# Patient Record
Sex: Male | Born: 1970 | Race: Black or African American | Hispanic: No | Marital: Single | Smoking: Never smoker
Health system: Southern US, Community
[De-identification: ages and names within clinical notes are randomized; demographics above are authoritative.]

## PROBLEM LIST (undated history)

## (undated) DIAGNOSIS — M722 Plantar fascial fibromatosis: Secondary | ICD-10-CM

## (undated) HISTORY — PX: APPENDECTOMY: SHX54

---

## 2015-01-28 ENCOUNTER — Other Ambulatory Visit: Payer: Self-pay | Admitting: Occupational Medicine

## 2015-01-28 ENCOUNTER — Ambulatory Visit: Payer: Self-pay

## 2015-01-28 DIAGNOSIS — M25572 Pain in left ankle and joints of left foot: Secondary | ICD-10-CM

## 2015-06-13 ENCOUNTER — Encounter (HOSPITAL_COMMUNITY): Payer: Self-pay | Admitting: Emergency Medicine

## 2015-06-13 ENCOUNTER — Emergency Department (INDEPENDENT_AMBULATORY_CARE_PROVIDER_SITE_OTHER)
Admission: EM | Admit: 2015-06-13 | Discharge: 2015-06-13 | Disposition: A | Discharge status: Home / Self Care | Payer: 59 | Source: Home / Self Care | Attending: Family Medicine | Admitting: Family Medicine

## 2015-06-13 DIAGNOSIS — K529 Noninfective gastroenteritis and colitis, unspecified: Secondary | ICD-10-CM | POA: Diagnosis not present

## 2015-06-13 MED ORDER — ONDANSETRON HCL 4 MG PO TABS: 4.0000 mg | ORAL_TABLET | Freq: Four times a day (QID) | ORAL | Status: DC

## 2015-06-13 NOTE — Discharge Instructions (Signed)

## 2015-06-13 NOTE — ED Provider Notes (Signed)
CSN: [id number]     Arrival date & time 06/13/15  1802 History   First MD Initiated Contact with Patient 06/13/15 1958     No chief complaint on file.  (Consider location/radiation/quality/duration/timing/severity/associated sxs/prior Treatment) HPI Vomiting, diarrhea since Wednesday,  States he is unable to keep fluids or foods down. Last vomiting this morning.  Feels dizzy at times.  No past medical history on file. No past surgical history on file. No family history on file. Social History  Substance Use Topics  . Smoking status: Not on file  . Smokeless tobacco: Not on file  . Alcohol Use: Not on file    Review of Systems ROS +'ve vomiting, and diarrhea  Denies: HEADACHE, NAUSEA, ABDOMINAL PAIN, CHEST PAIN, CONGESTION, DYSURIA, SHORTNESS OF BREATH   Allergies  Review of patient's allergies indicates not on file.  Home Medications   Prior to Admission medications   Not on File   Meds Ordered and Administered this Visit  Medications - No data to display  Temp(Src) 98.2 F (36.8 C) (Oral)  Resp 16  SpO2 98% Orthostatic VS for the past 24 hrs:  BP- Lying Pulse- Lying BP- Sitting Pulse- Sitting BP- Standing at 0 minutes Pulse- Standing at 0 minutes  06/13/15 1955 113/65 mmHg 64 114/74 mmHg 87 113/76 mmHg 95    Physical Exam  Constitutional: He is oriented to person, place, and time. He appears well-developed and well-nourished.  HENT:  Mouth/Throat: Oropharynx is clear and moist.  Pulmonary/Chest: Effort normal and breath sounds normal.  Abdominal: Soft. Bowel sounds are normal. There is no tenderness. There is no rebound.  Neurological: He is alert and oriented to person, place, and time.  Skin: Skin is warm and dry.  Psychiatric: He has a normal mood and affect. His behavior is normal. Judgment and thought content normal.    ED Course  Procedures (including critical care time)  Labs Review Labs Reviewed - No data to display  Imaging Review No results  found.   Visual Acuity Review  Right Eye Distance:   Left Eye Distance:   Bilateral Distance:    Right Eye Near:   Left Eye Near:    Bilateral Near:         MDM   1. Enteritis    Patient is advised to continue home symptomatic treatment. Prescription for zofran sent pharmacy patient has indicated. Patient is advised that if there are new or worsening symptoms or attend the emergency department, or contact primary care provider. Instructions of care provided discharged home in stable condition. Vomiting instructions provided.  THIS NOTE WAS GENERATED USING A VOICE RECOGNITION SOFTWARE PROGRAM. ALL REASONABLE EFFORTS  WERE MADE TO PROOFREAD THIS DOCUMENT FOR ACCURACY.     Philip AquasFrank C Patrick, PA 06/13/15 2015

## 2015-06-13 NOTE — ED Notes (Signed)
Reports vomiting and diarrhea since Wednesday 1/11.  Lives alone-no ill members in the home.

## 2015-09-07 ENCOUNTER — Encounter (HOSPITAL_COMMUNITY): Payer: Self-pay | Admitting: Emergency Medicine

## 2015-09-07 ENCOUNTER — Ambulatory Visit (HOSPITAL_COMMUNITY)
Admission: EM | Admit: 2015-09-07 | Discharge: 2015-09-07 | Disposition: A | Discharge status: Home / Self Care | Payer: 59 | Attending: Family Medicine | Admitting: Family Medicine

## 2015-09-07 DIAGNOSIS — M722 Plantar fascial fibromatosis: Secondary | ICD-10-CM

## 2015-09-07 MED ORDER — TRAMADOL HCL 50 MG PO TABS: 50.0000 mg | ORAL_TABLET | Freq: Four times a day (QID) | ORAL | Status: DC | PRN

## 2015-09-07 NOTE — ED Provider Notes (Signed)
CSN: [id number]     Arrival date & time 09/07/15  1811 History   First MD Initiated Contact with Patient 09/07/15 1944     Chief Complaint  Patient presents with  . Foot Pain   (Consider location/radiation/quality/duration/timing/severity/associated sxs/prior Treatment) Patient is a 45 y.o. male presenting with lower extremity pain. The history is provided by the patient. No language interpreter was used.  Foot Pain This is a new problem. The current episode started more than 1 week ago (C/O right heek pain for 3 months or more). Episode frequency: on and off. The problem has been gradually worsening. Pertinent negatives include no chest pain, no abdominal pain, no headaches and no shortness of breath. The symptoms are aggravated by walking and standing. Relieved by: No moving/rest. He has tried acetaminophen for the symptoms. The treatment provided mild relief.  He works 12 hours shift always on his feet and at the end of the shift his heel is throbbing. Also whenever he wakes in the morning, pain is worse with the first few steps. Pain now is about 5/10  In severity. No trauma or injury.  History reviewed. No pertinent past medical history. Past Surgical History  Procedure Laterality Date  . Appendectomy     History reviewed. No pertinent family history. Social History  Substance Use Topics  . Smoking status: Never Smoker   . Smokeless tobacco: None  . Alcohol Use: No    Review of Systems  Respiratory: Negative.  Negative for shortness of breath.   Cardiovascular: Negative.  Negative for chest pain.  Gastrointestinal: Negative for abdominal pain.  Musculoskeletal:       Right heel pain  Neurological: Negative for headaches.  All other systems reviewed and are negative.   Allergies  Review of patient's allergies indicates no known allergies.  Home Medications   Prior to Admission medications   Medication Sig Start Date End Date Taking? Authorizing Provider  bismuth  subsalicylate (PEPTO BISMOL) 262 MG/15ML suspension Take 30 mLs by mouth every 6 (six) hours as needed.    Historical Provider, MD  ondansetron (ZOFRAN) 4 MG tablet Take 1 tablet (4 mg total) by mouth every 6 (six) hours. 06/13/15   Tharon AquasFrank C Patrick, PA   Meds Ordered and Administered this Visit  Medications - No data to display  BP 132/91 mmHg  Pulse 60  Temp(Src) 98.6 F (37 C) (Oral)  SpO2 100% No data found.   Physical Exam  Constitutional: He appears well-developed. No distress.  Cardiovascular: Normal rate, regular rhythm and normal heart sounds.   No murmur heard. Pulmonary/Chest: Effort normal and breath sounds normal. No respiratory distress. He has no wheezes.  Musculoskeletal:       Left ankle: Normal.       Right foot: There is tenderness. There is normal range of motion.       Left foot: Normal.       Feet:  Nursing note and vitals reviewed.   ED Course  Procedures (including critical care time)  Labs Review Labs Reviewed - No data to display  Imaging Review No results found.   Visual Acuity Review  Right Eye Distance:   Left Eye Distance:   Bilateral Distance:    Right Eye Near:   Left Eye Near:    Bilateral Near:         MDM  No diagnosis found. Plantar fasciitis of right foot  Information given about plantar fasciitis. I recommended NSAID as needed for pain. Tramadol for breakthrough pain  prescribed. I recommended stretching exercise at home. I also recommended wearing of shoe insole for comfort. If no improvement plan to see PCP soon for imaging and podiatry referral. He agreed with plan. Return precaution discussed.    Doreene Eland, MD 09/07/15 2000

## 2015-09-07 NOTE — ED Notes (Signed)
[facility] a [facility] right heel pain x 3 months. The patient stated that he has not injured his ankle. The patient described the pain as burning that on occasionally waked him from sleep and while he is at work.

## 2015-09-07 NOTE — Discharge Instructions (Signed)
It was nice seeing you. I am sorry about your heel pain. It might be due to an inflammation of your heel ligament. We will treat with pain medication. If no improvement I will recommend an xray in the future. In the mean time in addition to pain medicine, do some stretching exercise of your foot and also wear shoe insole regularly. F/U as needed.  Plantar Fasciitis Plantar fasciitis is a painful foot condition that affects the heel. It occurs when the band of tissue that connects the toes to the heel bone (plantar fascia) becomes irritated. This can happen after exercising too much or doing other repetitive activities (overuse injury). The pain from plantar fasciitis can range from mild irritation to severe pain that makes it difficult for you to walk or move. The pain is usually worse in the morning or after you have been sitting or lying down for a while. CAUSES This condition may be caused by:  Standing for long periods of time.  Wearing shoes that do not fit.  Doing high-impact activities, including running, aerobics, and ballet.  Being overweight.  Having an abnormal way of walking (gait).  Having tight calf muscles.  Having high arches in your feet.  Starting a new athletic activity. SYMPTOMS The main symptom of this condition is heel pain. Other symptoms include:  Pain that gets worse after activity or exercise.  Pain that is worse in the morning or after resting.  Pain that goes away after you walk for a few minutes. DIAGNOSIS This condition may be diagnosed based on your signs and symptoms. Your health care provider will also do a physical exam to check for:  A tender area on the bottom of your foot.  A high arch in your foot.  Pain when you move your foot.  Difficulty moving your foot. You may also need to have imaging studies to confirm the diagnosis. These can include:  X-rays.  Ultrasound.  MRI. TREATMENT  Treatment for plantar fasciitis depends on the  severity of the condition. Your treatment may include:  Rest, ice, and over-the-counter pain medicines to manage your pain.  Exercises to stretch your calves and your plantar fascia.  A splint that holds your foot in a stretched, upward position while you sleep (night splint).  Physical therapy to relieve symptoms and prevent problems in the future.  Cortisone injections to relieve severe pain.  Extracorporeal shock wave therapy (ESWT) to stimulate damaged plantar fascia with electrical impulses. It is often used as a last resort before surgery.  Surgery, if other treatments have not worked after 12 months. HOME CARE INSTRUCTIONS  Take medicines only as directed by your health care provider.  Avoid activities that cause pain.  Roll the bottom of your foot over a bag of ice or a bottle of cold water. Do this for 20 minutes, 3-4 times a day.  Perform simple stretches as directed by your health care provider.  Try wearing athletic shoes with air-sole or gel-sole cushions or soft shoe inserts.  Wear a night splint while sleeping, if directed by your health care provider.  Keep all follow-up appointments with your health care provider. PREVENTION   Do not perform exercises or activities that cause heel pain.  Consider finding low-impact activities if you continue to have problems.  Lose weight if you need to. The best way to prevent plantar fasciitis is to avoid the activities that aggravate your plantar fascia. SEEK MEDICAL CARE IF:  Your symptoms do not go away after  treatment with home care measures.  Your pain gets worse.  Your pain affects your ability to move or do your daily activities.   This information is not intended to replace advice given to you by your health care provider. Make sure you discuss any questions you have with your health care provider.   Document Released: 02/09/2001 Document Revised: 02/05/2015 Document Reviewed: 03/27/2014 Elsevier Interactive  Patient Education [employer]2016 Elsevier Inc.

## 2015-11-20 ENCOUNTER — Encounter (HOSPITAL_COMMUNITY): Payer: Self-pay | Admitting: Emergency Medicine

## 2015-11-20 ENCOUNTER — Emergency Department (HOSPITAL_COMMUNITY)
Admission: EM | Admit: 2015-11-20 | Discharge: 2015-11-21 | Disposition: A | Discharge status: Home / Self Care | Payer: 59 | Attending: Emergency Medicine | Admitting: Emergency Medicine

## 2015-11-20 ENCOUNTER — Encounter (HOSPITAL_COMMUNITY): Payer: Self-pay | Admitting: Family Medicine

## 2015-11-20 ENCOUNTER — Emergency Department (HOSPITAL_COMMUNITY): Payer: 59

## 2015-11-20 ENCOUNTER — Ambulatory Visit (HOSPITAL_COMMUNITY)
Admission: EM | Admit: 2015-11-20 | Discharge: 2015-11-20 | Disposition: A | Discharge status: Nursing Home (Includes ICF) | Payer: 59 | Attending: Family Medicine | Admitting: Family Medicine

## 2015-11-20 ENCOUNTER — Ambulatory Visit (HOSPITAL_COMMUNITY): Payer: 59

## 2015-11-20 DIAGNOSIS — R1111 Vomiting without nausea: Secondary | ICD-10-CM | POA: Insufficient documentation

## 2015-11-20 DIAGNOSIS — R933 Abnormal findings on diagnostic imaging of other parts of digestive tract: Secondary | ICD-10-CM

## 2015-11-20 DIAGNOSIS — Z79899 Other long term (current) drug therapy: Secondary | ICD-10-CM | POA: Insufficient documentation

## 2015-11-20 DIAGNOSIS — J069 Acute upper respiratory infection, unspecified: Secondary | ICD-10-CM | POA: Insufficient documentation

## 2015-11-20 DIAGNOSIS — Z9889 Other specified postprocedural states: Secondary | ICD-10-CM | POA: Diagnosis not present

## 2015-11-20 DIAGNOSIS — K567 Ileus, unspecified: Secondary | ICD-10-CM | POA: Diagnosis not present

## 2015-11-20 DIAGNOSIS — R140 Abdominal distension (gaseous): Secondary | ICD-10-CM | POA: Insufficient documentation

## 2015-11-20 DIAGNOSIS — R109 Unspecified abdominal pain: Secondary | ICD-10-CM

## 2015-11-20 DIAGNOSIS — R1084 Generalized abdominal pain: Secondary | ICD-10-CM | POA: Insufficient documentation

## 2015-11-20 DIAGNOSIS — K5900 Constipation, unspecified: Secondary | ICD-10-CM | POA: Insufficient documentation

## 2015-11-20 DIAGNOSIS — R112 Nausea with vomiting, unspecified: Secondary | ICD-10-CM

## 2015-11-20 LAB — COMPREHENSIVE METABOLIC PANEL
ALBUMIN: 3.9 g/dL (ref 3.5–5.0)
ALT: 12 U/L — ABNORMAL LOW (ref 17–63)
ANION GAP: 10 (ref 5–15)
AST: 18 U/L (ref 15–41)
Alkaline Phosphatase: 63 U/L (ref 38–126)
BILIRUBIN TOTAL: 1.1 mg/dL (ref 0.3–1.2)
BUN: 17 mg/dL (ref 6–20)
CO2: 23 mmol/L (ref 22–32)
Calcium: 9.1 mg/dL (ref 8.9–10.3)
Chloride: 103 mmol/L (ref 101–111)
Creatinine, Ser: 1.28 mg/dL — ABNORMAL HIGH (ref 0.61–1.24)
GFR calc Af Amer: >60 mL/min (ref >60)
GFR calc non Af Amer: >60 mL/min (ref >60)
GLUCOSE: 101 mg/dL — AB (ref 65–99)
POTASSIUM: 3.8 mmol/L (ref 3.5–5.1)
Sodium: 136 mmol/L (ref 135–145)
TOTAL PROTEIN: 8.3 g/dL — AB (ref 6.5–8.1)

## 2015-11-20 LAB — CBC
HEMATOCRIT: 51.1 % (ref 39.0–52.0)
HEMOGLOBIN: 17.5 g/dL — AB (ref 13.0–17.0)
MCH: 29.9 pg (ref 26.0–34.0)
MCHC: 34.2 g/dL (ref 30.0–36.0)
MCV: 87.4 fL (ref 78.0–100.0)
Platelets: 278 10*3/uL (ref 150–400)
RBC: 5.85 MIL/uL — ABNORMAL HIGH (ref 4.22–5.81)
RDW: 14.1 % (ref 11.5–15.5)
WBC: 8.1 10*3/uL (ref 4.0–10.5)

## 2015-11-20 LAB — POCT I-STAT, CHEM 8
BUN: 20 mg/dL (ref 6–20)
CREATININE: 1.1 mg/dL (ref 0.61–1.24)
Calcium, Ion: 1.16 mmol/L (ref 1.12–1.23)
Chloride: 100 mmol/L — ABNORMAL LOW (ref 101–111)
Glucose, Bld: 98 mg/dL (ref 65–99)
HEMATOCRIT: 54 % — AB (ref 39.0–52.0)
HEMOGLOBIN: 18.4 g/dL — AB (ref 13.0–17.0)
Potassium: 4 mmol/L (ref 3.5–5.1)
SODIUM: 140 mmol/L (ref 135–145)
TCO2: 28 mmol/L (ref 0–100)

## 2015-11-20 LAB — POCT URINALYSIS DIP (DEVICE)
GLUCOSE, UA: NEGATIVE mg/dL
Hgb urine dipstick: NEGATIVE
Ketones, ur: 15 mg/dL — AB
Leukocytes, UA: NEGATIVE
NITRITE: NEGATIVE
PROTEIN: 100 mg/dL — AB
Specific Gravity, Urine: >=1.03 (ref 1.005–1.030)
UROBILINOGEN UA: 0.2 mg/dL (ref 0.0–1.0)
pH: 6 (ref 5.0–8.0)

## 2015-11-20 LAB — LIPASE, BLOOD: Lipase: 17 U/L (ref 11–51)

## 2015-11-20 MED ORDER — SODIUM CHLORIDE 0.9 % IV BOLUS (SEPSIS)
1000.0000 mL | Freq: Once | INTRAVENOUS | Status: AC
  Administered 2015-11-20: 1000 mL via INTRAVENOUS

## 2015-11-20 MED ORDER — HYDROCODONE-ACETAMINOPHEN 5-325 MG PO TABS: 1.0000 | ORAL_TABLET | Freq: Four times a day (QID) | ORAL | Status: DC | PRN

## 2015-11-20 MED ORDER — IOPAMIDOL (ISOVUE-300) INJECTION 61%
INTRAVENOUS | Status: AC
  Administered 2015-11-20: 100 mL via INTRAVENOUS
  Filled 2015-11-20: qty 100

## 2015-11-20 MED ORDER — ONDANSETRON HCL 4 MG PO TABS: 4.0000 mg | ORAL_TABLET | Freq: Four times a day (QID) | ORAL | Status: DC

## 2015-11-20 NOTE — ED Notes (Signed)
Pt here for generalized abd pain and unable to have a BM. sts when he sits down to have a BM only liquid comes out. Denies blood,.

## 2015-11-20 NOTE — Discharge Instructions (Signed)
Abdominal Pain, Adult °Many things can cause abdominal pain. Usually, abdominal pain is not caused by a disease and will improve without treatment. It can often be observed and treated at home. Your health care provider will do a physical exam and possibly order blood tests and X-rays to help determine the seriousness of your pain. However, in many cases, more time must pass before a clear cause of the pain can be found. Before that point, your health care provider may not know if you need more testing or further treatment. °HOME CARE INSTRUCTIONS °Monitor your abdominal pain for any changes. The following actions may help to alleviate any discomfort you are experiencing: °· Only take over-the-counter or prescription medicines as directed by your health care provider. °· Do not take laxatives unless directed to do so by your health care provider. °· Try a clear liquid diet (broth, tea, or water) as directed by your health care provider. Slowly move to a bland diet as tolerated. °SEEK MEDICAL CARE IF: °· You have unexplained abdominal pain. °· You have abdominal pain associated with nausea or diarrhea. °· You have pain when you urinate or have a bowel movement. °· You experience abdominal pain that wakes you in the night. °· You have abdominal pain that is worsened or improved by eating food. °· You have abdominal pain that is worsened with eating fatty foods. °· You have a fever. °SEEK IMMEDIATE MEDICAL CARE IF: °· Your pain does not go away within 2 hours. °· You keep throwing up (vomiting). °· Your pain is felt only in portions of the abdomen, such as the right side or the left lower portion of the abdomen. °· You pass bloody or black tarry stools. °MAKE SURE YOU: °· Understand these instructions. °· Will watch your condition. °· Will get help right away if you are not doing well or get worse. °  °This information is not intended to replace advice given to you by your health care provider. Make sure you discuss  any questions you have with your health care provider. °  °Document Released: 02/24/2005 Document Revised: 02/05/2015 Document Reviewed: 01/24/2013 °Elsevier Interactive Patient Education ©2016 Elsevier Inc. ° °Nausea and Vomiting °Nausea is a sick feeling that often comes before throwing up (vomiting). Vomiting is a reflex where stomach contents come out of your mouth. Vomiting can cause severe loss of body fluids (dehydration). Children and elderly adults can become dehydrated quickly, especially if they also have diarrhea. Nausea and vomiting are symptoms of a condition or disease. It is important to find the cause of your symptoms. °CAUSES  °· Direct irritation of the stomach lining. This irritation can result from increased acid production (gastroesophageal reflux disease), infection, food poisoning, taking certain medicines (such as nonsteroidal anti-inflammatory drugs), alcohol use, or tobacco use. °· Signals from the brain. These signals could be caused by a headache, heat exposure, an inner ear disturbance, increased pressure in the brain from injury, infection, a tumor, or a concussion, pain, emotional stimulus, or metabolic problems. °· An obstruction in the gastrointestinal tract (bowel obstruction). °· Illnesses such as diabetes, hepatitis, gallbladder problems, appendicitis, kidney problems, cancer, sepsis, atypical symptoms of a heart attack, or eating disorders. °· Medical treatments such as chemotherapy and radiation. °· Receiving medicine that makes you sleep (general anesthetic) during surgery. °DIAGNOSIS °Your caregiver may ask for tests to be done if the problems do not improve after a few days. Tests may also be done if symptoms are severe or if the reason for the   nausea and vomiting is not clear. Tests may include: °· Urine tests. °· Blood tests. °· Stool tests. °· Cultures (to look for evidence of infection). °· X-rays or other imaging studies. °Test results can help your caregiver make  decisions about treatment or the need for additional tests. °TREATMENT °You need to stay well hydrated. Drink frequently but in small amounts. You may wish to drink water, sports drinks, clear broth, or eat frozen ice pops or gelatin dessert to help stay hydrated. When you eat, eating slowly may help prevent nausea. There are also some antinausea medicines that may help prevent nausea. °HOME CARE INSTRUCTIONS  °· Take all medicine as directed by your caregiver. °· If you do not have an appetite, do not force yourself to eat. However, you must continue to drink fluids. °· If you have an appetite, eat a normal diet unless your caregiver tells you differently. °¨ Eat a variety of complex carbohydrates (rice, wheat, potatoes, bread), lean meats, yogurt, fruits, and vegetables. °¨ Avoid high-fat foods because they are more difficult to digest. °· Drink enough water and fluids to keep your urine clear or pale yellow. °· If you are dehydrated, ask your caregiver for specific rehydration instructions. Signs of dehydration may include: °¨ Severe thirst. °¨ Dry lips and mouth. °¨ Dizziness. °¨ Dark urine. °¨ Decreasing urine frequency and amount. °¨ Confusion. °¨ Rapid breathing or pulse. °SEEK IMMEDIATE MEDICAL CARE IF:  °· You have blood or brown flecks (like coffee grounds) in your vomit. °· You have black or bloody stools. °· You have a severe headache or stiff neck. °· You are confused. °· You have severe abdominal pain. °· You have chest pain or trouble breathing. °· You do not urinate at least once every 8 hours. °· You develop cold or clammy skin. °· You continue to vomit for longer than 24 to 48 hours. °· You have a fever. °MAKE SURE YOU:  °· Understand these instructions. °· Will watch your condition. °· Will get help right away if you are not doing well or get worse. °  °This information is not intended to replace advice given to you by your health care provider. Make sure you discuss any questions you have with  your health care provider. °  °Document Released: 05/17/2005 Document Revised: 08/09/2011 Document Reviewed: 10/14/2010 °Elsevier Interactive Patient Education ©2016 Elsevier Inc. ° °

## 2015-11-20 NOTE — ED Notes (Signed)
C/o neck/head pain, abd pain and feeling exhausted onset... Denies fevers... A&O x4... No acute distress.

## 2015-11-20 NOTE — ED Provider Notes (Signed)
CSN: [id number]     Arrival date & time 11/20/15  1322 History   First MD Initiated Contact with Patient 11/20/15 1436     Chief Complaint  Patient presents with  . URI   (Consider location/radiation/quality/duration/timing/severity/associated sxs/prior Treatment) HPI Comments: 45 year old male with abdominal pain for 2 days. The abdominal pain is generalized. He is also had very little appetite and by mouth intake and this period of time. He has had a liquid diarrhea 5 times since 3 AM today he is also had 3 episodes of vomiting today. He states the pain is constant and crampy. He is having some dizziness associated with standing and vomiting. Denies fever or chills.  Associated symptoms includes headache, neck soreness, dry mouth and throat for 2 days. He denies chest pain, shortness of breath, PND, earache or sore throat.  He has a history of an appendectomy several years ago. There was some complication and they ended up having to do a midline exploratory lap.   History reviewed. No pertinent past medical history. Past Surgical History  Procedure Laterality Date  . Appendectomy     History reviewed. No pertinent family history. Social History  Substance Use Topics  . Smoking status: Never Smoker   . Smokeless tobacco: None  . Alcohol Use: No    Review of Systems  Constitutional: Positive for activity change and appetite change. Negative for fever.  HENT: Negative.   Respiratory: Negative for cough and shortness of breath.   Cardiovascular: Negative for chest pain.  Gastrointestinal: Positive for nausea, vomiting, abdominal pain and abdominal distention. Negative for constipation.  Genitourinary: Negative.   Musculoskeletal: Positive for neck pain. Negative for neck stiffness.  Skin: Negative.   Neurological: Positive for dizziness. Negative for speech difficulty.  All other systems reviewed and are negative.   Allergies  Review of patient's allergies indicates no known  allergies.  Home Medications   Prior to Admission medications   Medication Sig Start Date End Date Taking? Authorizing Provider  bismuth subsalicylate (PEPTO BISMOL) 262 MG/15ML suspension Take 30 mLs by mouth every 6 (six) hours as needed.    Historical Provider, MD  ondansetron (ZOFRAN) 4 MG tablet Take 1 tablet (4 mg total) by mouth every 6 (six) hours. 06/13/15   Tharon AquasFrank C Patrick, PA  traMADol (ULTRAM) 50 MG tablet Take 1 tablet (50 mg total) by mouth every 6 (six) hours as needed. 09/07/15   Doreene ElandKehinde T Eniola, MD   Meds Ordered and Administered this Visit  Medications - No data to display  BP 123/75 mmHg  Pulse 95  Temp(Src) 98.6 F (37 C) (Oral)  Resp 20  SpO2 98% No data found.   Physical Exam  Constitutional: He is oriented to person, place, and time. He appears well-developed and well-nourished. No distress.  Eyes: EOM are normal.  Neck: Normal range of motion. Neck supple.  Cardiovascular: Normal rate, regular rhythm, normal heart sounds and intact distal pulses.   Pulmonary/Chest: Effort normal and breath sounds normal. No respiratory distress. He has no wheezes. He has no rales.  Abdominal: He exhibits distension. There is no rebound and no guarding.  Bowel sounds present but hypoactive particularly in the lower abdomen. Mild generalized tenderness Most of the abdomen percusses tympanic.  Musculoskeletal: He exhibits no edema.  Neurological: He is alert and oriented to person, place, and time.  Skin: Skin is warm and dry.  Psychiatric: He has a normal mood and affect.  Nursing note and vitals reviewed.   ED Course  Procedures (including critical care time)  Labs Review Labs Reviewed  POCT URINALYSIS DIP (DEVICE) - Abnormal; Notable for the following:    Bilirubin Urine MODERATE (*)    Ketones, ur 15 (*)    Protein, ur 100 (*)    All other components within normal limits  POCT I-STAT, CHEM 8 - Abnormal; Notable for the following:    Chloride 100 (*)     Hemoglobin 18.4 (*)    HCT 54.0 (*)    All other components within normal limits    Imaging Review Dg Abd 2 Views  11/20/2015  CLINICAL DATA:  Abdominal pain, nausea, vomiting, diarrhea and abdominal distention for 2 days, appendectomy 2013 EXAM: ABDOMEN - 2 VIEW COMPARISON:  None FINDINGS: Scattered gas in colon. Few air-filled loops of upper normal caliber small bowel in the mid abdomen. No definite evidence of obstruction or perforation. Bones unremarkable. No urine tract calcification. Minimal subsegmental atelectasis LEFT base. IMPRESSION: Nonspecific bowel gas pattern. Electronically Signed   By: Ulyses SouthwardMark  Boles M.D.   On: 11/20/2015 15:43     Visual Acuity Review  Right Eye Distance:   Left Eye Distance:   Bilateral Distance:    Right Eye Near:   Left Eye Near:    Bilateral Near:         MDM   1. Generalized abdominal pain   2. Gaseous abdominal distention   3. Non-intractable vomiting without nausea, vomiting of unspecified type   4. Multiple air fluid levels of small intestine determined by X-ray    [facility] of abdominal pain, abdominal distention, air-fluid levels on x-ray, vomiting and watery diarrhea. Little to no by mouth intake in 2 days. History of exploratory laparotomy associated with appendectomy several years ago. Possible abdominal adhesions.    Hayden Rasmussenavid Mckenna Boruff, NP 11/20/15 1554

## 2015-11-20 NOTE — ED Notes (Signed)
[facility]ray [facility]

## 2015-11-20 NOTE — ED Provider Notes (Signed)
CSN: [id number]     Arrival date & time 11/20/15  1605 History   First MD Initiated Contact with Patient 11/20/15 1936     Chief Complaint  Patient presents with  . Abdominal Pain  . Constipation     (Consider location/radiation/quality/duration/timing/severity/associated sxs/prior Treatment) HPI Comments: Patient presents to the emergency department with chief complaint of abdominal pain. He states that he has had crampy abdominal pain for the past 2 days. He denies having any bowel movements, stating that he has only passed liquid for the past 2 days. Additionally, he reports several episodes of vomiting. He has had several prior abdominal surgeries. He reports diffuse abdominal discomfort. He denies any fevers or chills.  There are no modifying factors.  The history is provided by the patient. No language interpreter was used.    History reviewed. No pertinent past medical history. Past Surgical History  Procedure Laterality Date  . Appendectomy     History reviewed. No pertinent family history. Social History  Substance Use Topics  . Smoking status: Never Smoker   . Smokeless tobacco: None  . Alcohol Use: No    Review of Systems  All other systems reviewed and are negative.     Allergies  Review of patient's allergies indicates no known allergies.  Home Medications   Prior to Admission medications   Medication Sig Start Date End Date Taking? Authorizing Provider  ondansetron (ZOFRAN) 4 MG tablet Take 1 tablet (4 mg total) by mouth every 6 (six) hours. Patient not taking: Reported on 11/20/2015 06/13/15   Tharon AquasFrank C Patrick, PA  traMADol (ULTRAM) 50 MG tablet Take 1 tablet (50 mg total) by mouth every 6 (six) hours as needed. Patient not taking: Reported on 11/20/2015 09/07/15   Doreene ElandKehinde T Eniola, MD   BP 111/70 mmHg  Pulse 76  Temp(Src) 98.5 F (36.9 C) (Oral)  Resp 21  SpO2 97% Physical Exam  Constitutional: He is oriented to person, place, and time. He appears  well-developed and well-nourished.  HENT:  Head: Normocephalic and atraumatic.  Eyes: Conjunctivae and EOM are normal. Pupils are equal, round, and reactive to light. Right eye exhibits no discharge. Left eye exhibits no discharge. No scleral icterus.  Neck: Normal range of motion. Neck supple. No JVD present.  Cardiovascular: Normal rate, regular rhythm and normal heart sounds.  Exam reveals no gallop and no friction rub.   No murmur heard. Pulmonary/Chest: Effort normal and breath sounds normal. No respiratory distress. He has no wheezes. He has no rales. He exhibits no tenderness.  Abdominal: Soft. He exhibits no distension and no mass. There is tenderness. There is no rebound and no guarding.  Diffuse abdominal tenderness  Musculoskeletal: Normal range of motion. He exhibits no edema or tenderness.  Neurological: He is alert and oriented to person, place, and time.  Skin: Skin is warm and dry.  Psychiatric: He has a normal mood and affect. His behavior is normal. Judgment and thought content normal.  Nursing note and vitals reviewed.   ED Course  Procedures (including critical care time) Results for orders placed or performed during the hospital encounter of 11/20/15  Lipase, blood  Result Value Ref Range   Lipase 17 11 - 51 U/L  Comprehensive metabolic panel  Result Value Ref Range   Sodium 136 135 - 145 mmol/L   Potassium 3.8 3.5 - 5.1 mmol/L   Chloride 103 101 - 111 mmol/L   CO2 23 22 - 32 mmol/L   Glucose, Bld 101 (H) 65 -  99 mg/dL   BUN 17 6 - 20 mg/dL   Creatinine, Ser 1.61 (H) 0.61 - 1.24 mg/dL   Calcium 9.1 8.9 - 09.6 mg/dL   Total Protein 8.3 (H) 6.5 - 8.1 g/dL   Albumin 3.9 3.5 - 5.0 g/dL   AST 18 15 - 41 U/L   ALT 12 (L) 17 - 63 U/L   Alkaline Phosphatase 63 38 - 126 U/L   Total Bilirubin 1.1 0.3 - 1.2 mg/dL   GFR calc non Af Amer >60 >60 mL/min   GFR calc Af Amer >60 >60 mL/min   Anion gap 10 5 - 15  CBC  Result Value Ref Range   WBC 8.1 4.0 - 10.5 K/uL    RBC 5.85 (H) 4.22 - 5.81 MIL/uL   Hemoglobin 17.5 (H) 13.0 - 17.0 g/dL   HCT 04.5 40.9 - 81.1 %   MCV 87.4 78.0 - 100.0 fL   MCH 29.9 26.0 - 34.0 pg   MCHC 34.2 30.0 - 36.0 g/dL   RDW 91.4 78.2 - 95.6 %   Platelets 278 150 - 400 K/uL   Ct Abdomen Pelvis W Contrast  11/20/2015  CLINICAL DATA:  Abdominal pain.  Evaluate for obstruction. EXAM: CT ABDOMEN AND PELVIS WITH CONTRAST TECHNIQUE: Multidetector CT imaging of the abdomen and pelvis was performed using the standard protocol following bolus administration of intravenous contrast. CONTRAST:  ISOVUE-300 IOPAMIDOL (ISOVUE-300) INJECTION 61% COMPARISON:  None. FINDINGS: Lower chest:  No acute findings. Hepatobiliary: No masses or other significant abnormality. Pancreas: No mass, inflammatory changes, or other significant abnormality. Spleen: Within normal limits in size and appearance. Adrenals/Urinary Tract: No masses identified. No evidence of hydronephrosis. Stomach/Bowel: Several fluid distended mid abdominal small bowel loops. Proximal and distal small bowel is decompressed however. There is no discrete transition zone. There is mild fluid distention of the proximal colon, decompressed more distally without discrete transition zone. There is no bowel wall thickening. No abnormal mucosal enhancement. Appendix surgically absent. Vascular/Lymphatic: No pathologically enlarged lymph nodes. No evidence of abdominal aortic aneurysm. Reproductive: No mass or other significant abnormality. Other: No ascites.  No free air. Musculoskeletal:  No suspicious bone lesions identified. IMPRESSION: 1. Scattered fluid distended loops of small and large bowel without overt dilatation, transition zone, or wall thickening, suggesting adynamic ileus more likely than obstruction. Electronically Signed   By: Corlis Leak M.D.   On: 11/20/2015 21:57   Dg Abd 2 Views  11/20/2015  CLINICAL DATA:  Abdominal pain, nausea, vomiting, diarrhea and abdominal distention for 2  days, appendectomy 2013 EXAM: ABDOMEN - 2 VIEW COMPARISON:  None FINDINGS: Scattered gas in colon. Few air-filled loops of upper normal caliber small bowel in the mid abdomen. No definite evidence of obstruction or perforation. Bones unremarkable. No urine tract calcification. Minimal subsegmental atelectasis LEFT base. IMPRESSION: Nonspecific bowel gas pattern. Electronically Signed   By: Ulyses Southward M.D.   On: 11/20/2015 15:43    I have personally reviewed and evaluated these images and lab results as part of my medical decision-making.   EKG Interpretation None      MDM   Final diagnoses:  Ileus (HCC)  Abdominal pain, unspecified abdominal location  Non-intractable vomiting with nausea, vomiting of unspecified type   Patient sent to ED from urgent care over concern for possible obstruction secondary to adhesions from multiple prior abdominal surgeries. Plain films performed an urgent care showed some evidence of dilated bowel loops and possible air-fluid levels. Will check CT given patient's pain,  and reassess.  CT scan is remarkable for scattered fluid distended loops of small and large bowel without evidence of obstruction, thought to more likely ileus.  Creatinine mildly increased at 1.28, likely mild dehydration.  Encouraged fluids.  Patient's pain is well controlled in the emergency department he is tolerating fluids. Patient discussed with Dr. Patria Maneampos, who agrees that as long as patient is tolerating fluids and feeling better he be discharged to home. Strict return precautions given. Patient understands and agrees with plan. He is stable and ready for discharge.   Roxy Horsemanobert Reaghan Kawa, PA-C 11/21/15 0007  Azalia BilisKevin Campos, MD 11/21/15 [id number]

## 2015-11-26 ENCOUNTER — Telehealth (HOSPITAL_BASED_OUTPATIENT_CLINIC_OR_DEPARTMENT_OTHER): Payer: Self-pay | Admitting: Emergency Medicine

## 2016-01-27 ENCOUNTER — Encounter (HOSPITAL_COMMUNITY): Payer: Self-pay | Admitting: Emergency Medicine

## 2016-01-27 ENCOUNTER — Ambulatory Visit (HOSPITAL_COMMUNITY)
Admission: EM | Admit: 2016-01-27 | Discharge: 2016-01-27 | Disposition: A | Discharge status: Home / Self Care | Payer: 59 | Attending: Family Medicine | Admitting: Family Medicine

## 2016-01-27 DIAGNOSIS — M722 Plantar fascial fibromatosis: Secondary | ICD-10-CM

## 2016-01-27 MED ORDER — MELOXICAM 15 MG PO TABS: 15.0000 mg | ORAL_TABLET | Freq: Every day | ORAL | 0 refills | Status: DC

## 2016-01-27 NOTE — ED Provider Notes (Signed)
CSN: [id number]     Arrival date & time 01/27/16  1304 History   First MD Initiated Contact with Patient 01/27/16 1327     Chief Complaint  Patient presents with  . Foot Pain   (Consider location/radiation/quality/duration/timing/severity/associated sxs/prior Treatment) HPI Patient reports that he has had issues with plantar fascitis since 2015.  He notes that he works 12 hour shifts on work boots on [employer] floors.  He notes that he did not perform the exercises and stretches as directed last visit.  Currently, not using anything over the counter for pain/ inflammation.  No icing.  Pain is worse at right medial calcaneus.  Pain is described as sharp.  He notes that has been flaring since April.  No known allergies.  Not taking any medications currently.  No falls, weakness, numbness.  Pain is worse with standing/ ambulation and in the morning time.  History reviewed. No pertinent past medical history. Past Surgical History:  Procedure Laterality Date  . APPENDECTOMY     History reviewed. No pertinent family history. Social History  Substance Use Topics  . Smoking status: Never Smoker  . Smokeless tobacco: Not on file  . Alcohol use No    Review of Systems  Constitutional: Positive for activity change. Negative for fever.  Musculoskeletal: Positive for arthralgias (right heel pain).  Skin: Negative for color change and wound.  Neurological: Negative for weakness and numbness.    Allergies  Review of patient's allergies indicates no known allergies.  Home Medications   Prior to Admission medications   Medication Sig Start Date End Date Taking? Authorizing Provider  HYDROcodone-acetaminophen (NORCO/VICODIN) 5-325 MG tablet Take 1-2 tablets by mouth every 6 (six) hours as needed. 11/20/15   Roxy Horsemanobert Browning, PA-C  ondansetron (ZOFRAN) 4 MG tablet Take 1 tablet (4 mg total) by mouth every 6 (six) hours. 11/20/15   Roxy Horsemanobert Browning, PA-C   Meds Ordered and Administered this Visit   Medications - No data to display  BP 134/84 (BP Location: Left Arm)   Pulse (!) 58   Temp 98.6 F (37 C) (Oral)   Resp 18   SpO2 99%  No data found.  Physical Exam  Constitutional: He is oriented to person, place, and time. He appears well-developed and well-nourished. No distress.  HENT:  Head: Normocephalic and atraumatic.  Eyes: Conjunctivae and EOM are normal.  Neck: Normal range of motion.  Cardiovascular: Normal rate, intact distal pulses and normal pulses.   Pulmonary/Chest: Effort normal.  Musculoskeletal:       Right foot: There is decreased range of motion (decreased dorsiflexion secondary to pain in heel) and tenderness (at the calcaneus and plantar aspect of mid foot). There is no swelling, normal capillary refill, no crepitus and no deformity.  Small callous formation at the medial aspect of the heel.  Patient ambulates independently.  Neurological: He is alert and oriented to person, place, and time. He has normal strength. No sensory deficit. Coordination normal.  Skin: He is not diaphoretic.  Psychiatric: He has a normal mood and affect. His behavior is normal.  Nursing note and vitals reviewed.   Urgent Care Course   Clinical Course    Procedures (including critical care time)  Labs Review Labs Reviewed - No data to display  Imaging Review No results found.   MDM   1. Plantar fasciitis of right foot    Naomie DeanJermaine Tagliaferro is a 45 y.o. male that presents with chronic heel pain.  Will treat conservatively with NSAID and ice/  stretching, as patient has not performed any at home stretches/ icing since onset.  Discussed steroid injection to plantar fascia.  Patient not amenable to this at this time.  Offered information for nearby orthopedic office w/ after hours appts should he change his mind.  Last Cr reviewed (small bump after CT scan contrast), which was essentially WNL.  Mobic rx sent to pharmacy.  Advised to eat with medication.  Patient ambulatory.  Home  stretches and care instructions reviewed.  Work note provided.  Patient discharged in stable condition.  Meds ordered this encounter  Medications  . meloxicam (MOBIC) 15 MG tablet    Sig: Take 1 tablet (15 mg total) by mouth daily.    Dispense:  14 tablet    Refill:  0    This patient was discussed with Nils Pyle, NP, who agrees with my assessment and plan.  Carman Auxier M. Nadine Counts, DO PGY-3, [facility] Medicine [facility]        Raliegh Ip, DO 01/27/16 1357

## 2016-01-27 NOTE — ED Triage Notes (Signed)
[facility] a [facility] left foot pain that has been ongoing for 2 years. The patient denied any known injury to the foot.

## 2016-01-27 NOTE — Discharge Instructions (Signed)
Make sure to do the stretches and rolling your foot on frozen water bottle as we discussed several times daily.  This will help loosen the fascia and improve your foot pain.  If your pain does not improve, you can consider receiving steroid injections at an orthopedic office.

## 2016-03-10 ENCOUNTER — Encounter (HOSPITAL_COMMUNITY): Payer: Self-pay | Admitting: Emergency Medicine

## 2016-03-10 ENCOUNTER — Ambulatory Visit (HOSPITAL_COMMUNITY)
Admission: EM | Admit: 2016-03-10 | Discharge: 2016-03-10 | Disposition: A | Discharge status: Home / Self Care | Payer: 59 | Attending: Family Medicine | Admitting: Family Medicine

## 2016-03-10 DIAGNOSIS — M722 Plantar fascial fibromatosis: Secondary | ICD-10-CM

## 2016-03-10 HISTORY — DX: Plantar fascial fibromatosis: M72.2

## 2016-03-10 MED ORDER — MELOXICAM 15 MG PO TABS: 15.0000 mg | ORAL_TABLET | Freq: Every day | ORAL | 0 refills | Status: DC

## 2016-03-10 MED ORDER — LIDOCAINE HCL 2 % IJ SOLN
INTRAMUSCULAR | Status: AC
  Filled 2016-03-10: qty 20

## 2016-03-10 MED ORDER — TRIAMCINOLONE ACETONIDE 40 MG/ML IJ SUSP
INTRAMUSCULAR | Status: AC
  Filled 2016-03-10: qty 1

## 2016-03-10 NOTE — ED Triage Notes (Signed)
Patient has a history of plantar fasciitis.  Patient reports this is the problem. Patient asking for an injection.  This recurrent issue started hurting again today

## 2016-03-10 NOTE — ED Provider Notes (Signed)
CSN: [id number]     Arrival date & time 03/10/16  1312 History   None    Chief Complaint  Patient presents with  . Foot Pain   (Consider location/radiation/quality/duration/timing/severity/associated sxs/prior Treatment) Patient has been having pain in his right heel.  He has been dx with plantar fascitis and has been taking meloxicam and rolling foot on frozen water bottle but it is not helping.   The history is provided by the patient.  Foot Pain  This is a new problem. The current episode started yesterday. The problem occurs constantly. The problem has not changed since onset.The symptoms are aggravated by walking. The symptoms are relieved by walking.    Past Medical History:  Diagnosis Date  . Plantar fasciitis    Past Surgical History:  Procedure Laterality Date  . APPENDECTOMY     No family history on file. Social History  Substance Use Topics  . Smoking status: Never Smoker  . Smokeless tobacco: Not on file  . Alcohol use No    Review of Systems  Constitutional: Negative.   HENT: Negative.   Eyes: Negative.   Respiratory: Negative.   Cardiovascular: Negative.   Gastrointestinal: Negative.   Endocrine: Negative.   Genitourinary: Negative.   Musculoskeletal: Positive for joint swelling and myalgias.  Allergic/Immunologic: Negative.   Neurological: Negative.   Hematological: Negative.   Psychiatric/Behavioral: Negative.     Allergies  Review of patient's allergies indicates no known allergies.  Home Medications   Prior to Admission medications   Medication Sig Start Date End Date Taking? Authorizing Provider  HYDROcodone-acetaminophen (NORCO/VICODIN) 5-325 MG tablet Take 1-2 tablets by mouth every 6 (six) hours as needed. 11/20/15   Roxy Horsemanobert Browning, PA-C  meloxicam (MOBIC) 15 MG tablet Take 1 tablet (15 mg total) by mouth daily. 01/27/16   Ashly Hulen SkainsM Gottschalk, DO  meloxicam (MOBIC) 15 MG tablet Take 1 tablet (15 mg total) by mouth daily. 03/10/16   Deatra CanterWilliam  J Lindalou Soltis, FNP  ondansetron (ZOFRAN) 4 MG tablet Take 1 tablet (4 mg total) by mouth every 6 (six) hours. 11/20/15   Roxy Horsemanobert Browning, PA-C   Meds Ordered and Administered this Visit  Medications - No data to display  There were no vitals taken for this visit. No data found.   Physical Exam  Constitutional: He appears well-developed and well-nourished.  HENT:  Head: Normocephalic and atraumatic.  Eyes: EOM are normal. Pupils are equal, round, and reactive to light.  Neck: Normal range of motion. Neck supple.  Cardiovascular: Normal rate, regular rhythm and normal heart sounds.   Pulmonary/Chest: Effort normal and breath sounds normal.  Abdominal: Soft. Bowel sounds are normal.  Musculoskeletal:  Right heel and foot tender with palpation.    Urgent Care Course   Clinical Course    .Joint Aspiration/Arthrocentesis Date/Time: 03/10/2016 4:19 PM Performed by: Deatra CanterXFORD, Landry Kamath J Authorized by: Deatra CanterXFORD, Tobias Avitabile J   Consent:    Consent obtained:  Verbal   Consent given by:  Patient   Risks discussed:  Bleeding and pain   Alternatives discussed:  No treatment Location:    Location:  Ankle (Right foot)   Ankle joint: Right heel and foot. Procedure details:    Needle gauge: 27 guage.   Ultrasound guidance: no     Approach:  Medial   Aspirate amount:  No aspiration   Steroid injected: yes   Post-procedure details:    Dressing:  Adhesive bandage   Patient tolerance of procedure:  Tolerated well, no immediate complications Comments:  Heel injection with 2 cc's of 1 % lidocaine and 40mg  Kenalog injected into right heel foot from medial foot over where the pain was and patient expresses immediate relief of pain.   (including critical care time)  Labs Review Labs Reviewed - No data to display  Imaging Review No results found.   Visual Acuity Review  Right Eye Distance:   Left Eye Distance:   Bilateral Distance:    Right Eye Near:   Left Eye Near:    Bilateral  Near:         MDM   1. Plantar fasciitis of right foot    Meloxicam 15mg  one po qd #30 Right Heel injection Off work tomorrow and stay off of right foot.    Deatra Canter, FNP 03/10/16 1622

## 2016-03-13 ENCOUNTER — Ambulatory Visit (HOSPITAL_COMMUNITY)
Admission: EM | Admit: 2016-03-13 | Discharge: 2016-03-13 | Disposition: A | Discharge status: Home / Self Care | Payer: 59 | Attending: Internal Medicine | Admitting: Internal Medicine

## 2016-03-13 ENCOUNTER — Encounter (HOSPITAL_COMMUNITY): Payer: Self-pay | Admitting: *Deleted

## 2016-03-13 DIAGNOSIS — M722 Plantar fascial fibromatosis: Secondary | ICD-10-CM | POA: Diagnosis not present

## 2016-03-13 NOTE — ED Triage Notes (Signed)
Pt     Reports  He    Was   Seen   Several  Days  3   Days  Ago  For  Planter fascitis  He  Continues  To  Have  Pain in the  r  Foot      He  Reports  Pain  Was  Worse   When he  Woke  Up  This am       He  denys  Any  Recent  specefic  Injury

## 2016-03-13 NOTE — ED Provider Notes (Signed)
CSN: [id number]     Arrival date & time 03/13/16  1309 History   First MD Initiated Contact with Patient 03/13/16 1510     Chief Complaint  Patient presents with  . Foot Pain   (Consider location/radiation/quality/duration/timing/severity/associated sxs/prior Treatment) Philip Acosta is a well-appearing 45 y.o male, presents today for right heel pain. He was first seem on 08/29 and was diagnosed with plantar fasciitis and got send home to treat conservatively with NSAID and been rolling the foot on frozen iced water bottle.  He was evaluated again on 10/11 for the same heel pain and was given Kenalog steroid injection for his plantar fasciitis. He returned today reporting that his heel pain is not better. He reports the steroid injection worked for a few hours but then the pain returned. He reports pain to be worst with ambulation. He is requesting note to be off work for a few days.       Past Medical History:  Diagnosis Date  . Plantar fasciitis    Past Surgical History:  Procedure Laterality Date  . APPENDECTOMY     History reviewed. No pertinent family history. Social History  Substance Use Topics  . Smoking status: Never Smoker  . Smokeless tobacco: Not on file  . Alcohol use No    Review of Systems  All other systems reviewed and are negative.   Allergies  Review of patient's allergies indicates no known allergies.  Home Medications   Prior to Admission medications   Medication Sig Start Date End Date Taking? Authorizing Provider  HYDROcodone-acetaminophen (NORCO/VICODIN) 5-325 MG tablet Take 1-2 tablets by mouth every 6 (six) hours as needed. 11/20/15   Roxy Horseman, PA-C  meloxicam (MOBIC) 15 MG tablet Take 1 tablet (15 mg total) by mouth daily. 01/27/16   Ashly Hulen Skains, DO  meloxicam (MOBIC) 15 MG tablet Take 1 tablet (15 mg total) by mouth daily. 03/10/16   Deatra Canter, FNP  ondansetron (ZOFRAN) 4 MG tablet Take 1 tablet (4 mg total) by mouth every 6  (six) hours. 11/20/15   Roxy Horseman, PA-C   Meds Ordered and Administered this Visit  Medications - No data to display  BP 126/68 (BP Location: Left Arm)   Pulse 65   Temp 98.4 F (36.9 C) (Oral)   Resp 12   SpO2 100%  No data found.   Physical Exam  Constitutional: He is oriented to person, place, and time. He appears well-developed and well-nourished.  HENT:  Head: Normocephalic and atraumatic.  Cardiovascular: Normal rate.   Pulmonary/Chest: Effort normal.  Musculoskeletal:  Feet are symmetrical. Right foot tender to palpate over the posterior and medial aspect of heel over the calcaneus. Patient has intact motor and sensation. Patient noted to be wearing a house slipper.  Neurological: He is alert and oriented to person, place, and time.    Urgent Care Course   Clinical Course    Procedures (including critical care time)  Labs Review Labs Reviewed - No data to display  Imaging Review No results found.   MDM   1. Plantar fasciitis of right foot    1) Patient have tried stretching, Ice, NSAID, rolling the foot on the frozen water bottle with no relief. 2) He got a steroid injection to the area 3 days ago with no relief as well 3) Patient is taking Meloxicam for pain and reports not helping.  4) Plan of care today: refer to podiatry, meanwhile continue to roll the foot on the frozen water  bottle, take Ibuprofen for pain.  5) Patient denies any questions. Discharge paperwork given.      [name], NP 03/13/16 [phone]

## 2016-05-13 ENCOUNTER — Encounter (HOSPITAL_COMMUNITY): Payer: Self-pay | Admitting: Emergency Medicine

## 2016-05-13 ENCOUNTER — Ambulatory Visit (HOSPITAL_COMMUNITY)
Admission: EM | Admit: 2016-05-13 | Discharge: 2016-05-13 | Disposition: A | Discharge status: Home / Self Care | Payer: 59 | Attending: Emergency Medicine | Admitting: Emergency Medicine

## 2016-05-13 ENCOUNTER — Ambulatory Visit (INDEPENDENT_AMBULATORY_CARE_PROVIDER_SITE_OTHER): Payer: 59

## 2016-05-13 DIAGNOSIS — M25561 Pain in right knee: Secondary | ICD-10-CM

## 2016-05-13 DIAGNOSIS — S86911A Strain of unspecified muscle(s) and tendon(s) at lower leg level, right leg, initial encounter: Secondary | ICD-10-CM

## 2016-05-13 DIAGNOSIS — M722 Plantar fascial fibromatosis: Secondary | ICD-10-CM | POA: Diagnosis not present

## 2016-05-13 MED ORDER — NAPROXEN 500 MG PO TABS: 500.0000 mg | ORAL_TABLET | Freq: Two times a day (BID) | ORAL | 0 refills | Status: DC

## 2016-05-13 NOTE — ED Triage Notes (Signed)
Pt. Stated, I hurt my right knee yesterday at work and I already have plantar fascitis , and ankle pain.  I stayed out of work today and used Tylenol and just rested.

## 2016-05-13 NOTE — ED Provider Notes (Signed)
CSN: [id number]     Arrival date & time 05/13/16  1747 History   First MD Initiated Contact with Patient 05/13/16 1814     Chief Complaint  Patient presents with  . Knee Pain   (Consider location/radiation/quality/duration/timing/severity/associated sxs/prior Treatment) Pt has a previous injury to rt ankle that he is being seen for at ortho office and receiving steroid injections for. States due to not being able to place full wt on ankle he twisted rt knee while walking yesterday. Thinks he heard a "pop". Was feeling ok last night but not able to apply pressure this am. Has elevated but has not taken anything for this. Pt asking for an xray.       Past Medical History:  Diagnosis Date  . Plantar fasciitis    Past Surgical History:  Procedure Laterality Date  . APPENDECTOMY     No family history on file. Social History  Substance Use Topics  . Smoking status: Never Smoker  . Smokeless tobacco: Never Used  . Alcohol use Yes    Review of Systems  Constitutional: Negative.   Respiratory: Negative.   Cardiovascular: Negative.   Musculoskeletal: Positive for joint swelling.       Rt knee pain anterior and lateral   Skin: Negative.   Neurological: Negative.     Allergies  Patient has no known allergies.  Home Medications   Prior to Admission medications   Medication Sig Start Date End Date Taking? Authorizing Provider  HYDROcodone-acetaminophen (NORCO/VICODIN) 5-325 MG tablet Take 1-2 tablets by mouth every 6 (six) hours as needed. 11/20/15   Roxy Horsemanobert Browning, PA-C  meloxicam (MOBIC) 15 MG tablet Take 1 tablet (15 mg total) by mouth daily. 01/27/16   Ashly Hulen SkainsM Gottschalk, DO  meloxicam (MOBIC) 15 MG tablet Take 1 tablet (15 mg total) by mouth daily. 03/10/16   Deatra CanterWilliam J Oxford, FNP  ondansetron (ZOFRAN) 4 MG tablet Take 1 tablet (4 mg total) by mouth every 6 (six) hours. 11/20/15   Roxy Horsemanobert Browning, PA-C   Meds Ordered and Administered this Visit  Medications - No data to  display  BP 154/91 (BP Location: Left Arm)   Pulse (!) 9   Temp 98.3 F (36.8 C) (Oral)   Resp 17   SpO2 96%  No data found.   Physical Exam  Constitutional: He appears well-developed.  Cardiovascular: Normal rate and regular rhythm.   Pulmonary/Chest: Effort normal and breath sounds normal.  Musculoskeletal: He exhibits tenderness.  Rt knee anterior and lateral with flexion. Denies any pain with extension. Strong pulses. Visual no deformity or difference bil. No erythema. Pain medial to petella   Neurological: He is alert.  Skin: Skin is warm and dry. Capillary refill takes less than 2 seconds.    Urgent Care Course   Clinical Course     Procedures (including critical care time)  Labs Review Labs Reviewed - No data to display  Imaging Review No results found.           MDM   1. Acute pain of right knee   2. Strain of right knee, initial encounter    Your x ray was negative for a fracture.  Apply knee brace to help with swelling and support Follow up with [facility] since you have seen him in the past Take NSAIDS as needed for pain.  May use ice and heat as needed     Tobi BastosMelanie A Nixon [place]ton, NP 05/13/16 1857

## 2016-05-13 NOTE — ED Notes (Signed)
Large  Knee   Sleeve  Applied

## 2016-08-16 ENCOUNTER — Encounter (HOSPITAL_COMMUNITY): Payer: Self-pay | Admitting: Emergency Medicine

## 2016-08-16 ENCOUNTER — Ambulatory Visit (HOSPITAL_COMMUNITY)
Admission: EM | Admit: 2016-08-16 | Discharge: 2016-08-16 | Disposition: A | Discharge status: Home / Self Care | Payer: 59 | Attending: Family Medicine | Admitting: Family Medicine

## 2016-08-16 DIAGNOSIS — K2900 Acute gastritis without bleeding: Secondary | ICD-10-CM | POA: Diagnosis not present

## 2016-08-16 DIAGNOSIS — B349 Viral infection, unspecified: Secondary | ICD-10-CM | POA: Diagnosis not present

## 2016-08-16 DIAGNOSIS — R1111 Vomiting without nausea: Secondary | ICD-10-CM

## 2016-08-16 MED ORDER — ONDANSETRON HCL 4 MG PO TABS: 4.0000 mg | ORAL_TABLET | Freq: Four times a day (QID) | ORAL | 0 refills | Status: DC

## 2016-08-16 NOTE — ED Provider Notes (Signed)
CSN: [id number]     Arrival date & time 08/16/16  1316 History   First MD Initiated Contact with Patient 08/16/16 1442     Chief Complaint  Patient presents with  . URI   (Consider location/radiation/quality/duration/timing/severity/associated sxs/prior Treatment) 46 year old male states that for the past 3 days he said some body aches minor sore throat. Early this morning around 2 AM he started vomiting for total 4 times thus far. He states he had a fever, thinks it may have been around 102 but he cannot remember. Complains of body aches. He is not taking any medication.      Past Medical History:  Diagnosis Date  . Plantar fasciitis    Past Surgical History:  Procedure Laterality Date  . APPENDECTOMY     No family history on file. Social History  Substance Use Topics  . Smoking status: Never Smoker  . Smokeless tobacco: Never Used  . Alcohol use Yes    Review of Systems  Constitutional: Positive for activity change and fever. Negative for diaphoresis and fatigue.  HENT: Positive for sore throat. Negative for congestion, ear pain, facial swelling, postnasal drip, rhinorrhea and trouble swallowing.   Eyes: Negative for pain, discharge and redness.  Respiratory: Positive for cough. Negative for chest tightness and shortness of breath.   Cardiovascular: Negative.   Gastrointestinal: Positive for vomiting. Negative for abdominal pain.  Musculoskeletal: Positive for myalgias. Negative for neck pain and neck stiffness.  Skin: Negative for rash.  Neurological: Negative.   All other systems reviewed and are negative.   Allergies  Patient has no known allergies.  Home Medications   Prior to Admission medications   Medication Sig Start Date End Date Taking? Authorizing Provider  ondansetron (ZOFRAN) 4 MG tablet Take 1 tablet (4 mg total) by mouth every 6 (six) hours. 08/16/16   Hayden Rasmussen, NP   Meds Ordered and Administered this Visit  Medications - No data to  display  BP 122/80 (BP Location: Right Arm)   Pulse 93   Temp 98.5 F (36.9 C) (Oral)   Resp 16   SpO2 99%  No data found.   Physical Exam  Constitutional: He is oriented to person, place, and time. He appears well-developed and well-nourished. No distress.  HENT:  Mouth/Throat: No oropharyngeal exudate.  Bilateral TMs are normal. Oropharynx with minor erythema and cobblestoning.  Eyes: EOM are normal.  Neck: Normal range of motion. Neck supple.  Cardiovascular: Normal rate and regular rhythm.   Pulmonary/Chest: Effort normal and breath sounds normal. No respiratory distress. He has no wheezes.  Abdominal: Soft. He exhibits no distension.  Musculoskeletal: Normal range of motion. He exhibits no edema.  Lymphadenopathy:    He has no cervical adenopathy.  Neurological: He is alert and oriented to person, place, and time.  Skin: Skin is warm and dry. No rash noted.  Psychiatric: He has a normal mood and affect.  Nursing note and vitals reviewed.   Urgent Care Course     Procedures (including critical care time)  Labs Review Labs Reviewed - No data to display  Imaging Review No results found.   Visual Acuity Review  Right Eye Distance:   Left Eye Distance:   Bilateral Distance:    Right Eye Near:   Left Eye Near:    Bilateral Near:         MDM   1. Viral syndrome   2. Other acute gastritis without hemorrhage   3. Vomiting without nausea, intractability of vomiting not  specified, unspecified vomiting type    Take Zofran as directed for nausea and vomiting. Primarily drink clear liquids and no dairy products, fried foods or fast foods for the next 24 hours or so. Take Tylenol every 4 hours for fever, aches and pains. He may also take antihistamine such as Allegra or Zyrtec for any sore throat or drainage. Meds ordered this encounter  Medications  . ondansetron (ZOFRAN) 4 MG tablet    Sig: Take 1 tablet (4 mg total) by mouth every 6 (six) hours.     Dispense:  12 tablet    Refill:  0    Order Specific Question:   Supervising Provider    Answer:   Elvina SidleLAUENSTEIN, KURT [[id number]]       Hayden Rasmussenavid Onica Davidovich, NP 08/16/16 1500    Hayden Rasmussenavid Rosamary Boudreau, NP 08/16/16 1501

## 2016-08-16 NOTE — Discharge Instructions (Signed)
Take Zofran as directed for nausea and vomiting. Primarily drink clear liquids and no dairy products, fried foods or fast foods for the next 24 hours or so. Take Tylenol every 4 hours for fever, aches and pains. He may also take antihistamine such as Allegra or Zyrtec for any sore throat or drainage.

## 2016-08-16 NOTE — ED Triage Notes (Signed)
General aches and pain, headache, intermittent fever, vomited 4 times today denies diarrhea

## 2016-11-16 ENCOUNTER — Encounter (HOSPITAL_COMMUNITY): Payer: Self-pay | Admitting: Emergency Medicine

## 2016-11-16 ENCOUNTER — Ambulatory Visit (HOSPITAL_COMMUNITY)
Admission: EM | Admit: 2016-11-16 | Discharge: 2016-11-16 | Disposition: A | Discharge status: Home / Self Care | Payer: 59 | Attending: Family Medicine | Admitting: Family Medicine

## 2016-11-16 DIAGNOSIS — R42 Dizziness and giddiness: Secondary | ICD-10-CM

## 2016-11-16 DIAGNOSIS — E860 Dehydration: Secondary | ICD-10-CM

## 2016-11-16 LAB — POCT I-STAT, CHEM 8
BUN: 13 mg/dL (ref 6–20)
CREATININE: 1 mg/dL (ref 0.61–1.24)
Calcium, Ion: 1.17 mmol/L (ref 1.15–1.40)
Chloride: 103 mmol/L (ref 101–111)
GLUCOSE: 93 mg/dL (ref 65–99)
HEMATOCRIT: 50 % (ref 39.0–52.0)
HEMOGLOBIN: 17 g/dL (ref 13.0–17.0)
POTASSIUM: 4.3 mmol/L (ref 3.5–5.1)
Sodium: 141 mmol/L (ref 135–145)
TCO2: 27 mmol/L (ref 0–100)

## 2016-11-16 NOTE — ED Provider Notes (Signed)
[facility]    CSN: [id number] Arrival date & time: 11/16/16  1055     History   Chief Complaint Chief Complaint  Patient presents with  . Dizziness    HPI Andre Swander is a 46 y.o. male.   This a 46 year old man who works at a [employer] center. He's had some lightheaded feelings over the last 48 hours since he was some nausea this been continuous and intermittent chest pain. He does not have any chest pain at the present time.  He believes the heat the last couple of days is responsible for his symptoms.  Patient does not have a history of hypertension or diabetes, and both parents are still living.  He does not smoke      Past Medical History:  Diagnosis Date  . Plantar fasciitis     There are no active problems to display for this patient.   Past Surgical History:  Procedure Laterality Date  . APPENDECTOMY         Home Medications    Prior to Admission medications   Not on File    Family History History reviewed. No pertinent family history.  Social History Social History  Substance Use Topics  . Smoking status: Never Smoker  . Smokeless tobacco: Never Used  . Alcohol use Yes     Allergies   Patient has no known allergies.   Review of Systems Review of Systems  Constitutional: Positive for fatigue.  HENT: Negative.   Respiratory: Negative.   Cardiovascular: Positive for chest pain.  Gastrointestinal: Positive for nausea. Negative for abdominal pain and vomiting.  Genitourinary: Negative.   Musculoskeletal: Negative.   Neurological: Positive for light-headedness.  Psychiatric/Behavioral: Negative.   All other systems reviewed and are negative.    Physical Exam Triage Vital Signs ED Triage Vitals  Enc Vitals Group     BP 11/16/16 1130 121/81     Pulse Rate 11/16/16 1130 69     Resp 11/16/16 1130 18     Temp 11/16/16 1130 98.3 F (36.8 C)     Temp Source 11/16/16 1130 Oral     SpO2 11/16/16 1130 98 %     Weight --       Height --      Head Circumference --      Peak Flow --      Pain Score 11/16/16 1129 0     Pain Loc --      Pain Edu? --      Excl. in GC? --    Orthostatic VS for the past 24 hrs:  BP- Lying Pulse- Lying BP- Sitting Pulse- Sitting BP- Standing at 0 minutes Pulse- Standing at 0 minutes  11/16/16 1219 112/75 52 122/82 71 111/81 74    Updated Vital Signs BP 121/81 (BP Location: Right Arm)   Pulse 69   Temp 98.3 F (36.8 C) (Oral)   Resp 18   SpO2 98%    Physical Exam  Constitutional: He is oriented to person, place, and time. He appears well-developed and well-nourished.  HENT:  Right Ear: External ear normal.  Left Ear: External ear normal.  Mouth/Throat: Oropharynx is clear and moist.  Eyes: Conjunctivae and EOM are normal. Pupils are equal, round, and reactive to light.  Neck: Normal range of motion. Neck supple.  Cardiovascular: Normal rate, regular rhythm and normal heart sounds.   Pulmonary/Chest: Effort normal and breath sounds normal.  Abdominal: Soft. There is no tenderness.  Musculoskeletal: Normal range of motion.  Neurological: He is alert and oriented to person, place, and time.  Skin: Skin is warm and dry.  Nursing note and vitals reviewed.    UC Treatments / Results  Labs (all labs ordered are listed, but only abnormal results are displayed) Labs Reviewed  POCT I-STAT, CHEM 8    EKG Sinus bradycardia with no ST changes  Radiology No results found.  Procedures Procedures (including critical care time)  Medications Ordered in UC Medications - No data to display   Initial Impression / Assessment and Plan / UC Course  I have reviewed the triage vital signs and the nursing notes.  Pertinent labs & imaging results that were available during my care of the patient were reviewed by me and considered in my medical decision making (see chart for details).     Final Clinical Impressions(s) / UC Diagnoses   Final diagnoses:  Lightheadedness   Dehydration    New Prescriptions New Prescriptions   No medications on file     Elvina SidleLauenstein, Phil Michels, MD 11/16/16 1232

## 2016-11-16 NOTE — Discharge Instructions (Signed)
Your electrocardiogram shows no serious changes at this time. Your laboratory test were normal. I suspect that you have some degree of heat exhaustion and dehydration. Therefore I am asking you to stay home today and tomorrow and rest and drink plenty of fluids.

## 2016-11-16 NOTE — ED Triage Notes (Signed)
[facility] a [facility] of lightheadedness x 2 days that he felt was related to the heat.

## 2016-11-18 ENCOUNTER — Encounter (HOSPITAL_COMMUNITY): Payer: Self-pay | Admitting: Emergency Medicine

## 2016-11-18 ENCOUNTER — Ambulatory Visit (HOSPITAL_COMMUNITY)
Admission: EM | Admit: 2016-11-18 | Discharge: 2016-11-18 | Disposition: A | Discharge status: Home / Self Care | Payer: 59 | Attending: Family Medicine | Admitting: Family Medicine

## 2016-11-18 DIAGNOSIS — R42 Dizziness and giddiness: Secondary | ICD-10-CM

## 2016-11-18 DIAGNOSIS — R112 Nausea with vomiting, unspecified: Secondary | ICD-10-CM | POA: Diagnosis not present

## 2016-11-18 MED ORDER — ONDANSETRON 4 MG PO TBDP: 4.0000 mg | ORAL_TABLET | Freq: Three times a day (TID) | ORAL | 0 refills | Status: DC | PRN

## 2016-11-18 MED ORDER — SODIUM CHLORIDE 0.9 % IV SOLN
Freq: Once | INTRAVENOUS | Status: AC
  Administered 2016-11-18: 1000 mL/h via INTRAVENOUS

## 2016-11-18 MED ORDER — ONDANSETRON HCL 4 MG/2ML IJ SOLN
INTRAMUSCULAR | Status: AC
  Filled 2016-11-18: qty 2

## 2016-11-18 MED ORDER — ONDANSETRON HCL 4 MG/2ML IJ SOLN
4.0000 mg | Freq: Once | INTRAMUSCULAR | Status: AC
  Administered 2016-11-18: 4 mg via INTRAVENOUS

## 2016-11-18 NOTE — Discharge Instructions (Signed)
You received IV fluids along with ondansetron (a medicine for nausea) today. If you continue to experience significant nausea and vomiting that prevents you from eating and drinking please proceed to the Emergency Department for evaluation.

## 2016-11-18 NOTE — ED Triage Notes (Signed)
Pt reports still feeling shaky since Tuesday.  He states he hasn't eaten since we saw him.  He reports not being able to keep even water down.  He drinks it and it comes right back up.  Pt states he has been in bed for two days.  Pt states we wrote him out of work until today and he was unable to return to work today.  Still reports a headache. He took Tylenol for the headache but states he vomited after taking it.

## 2016-11-21 NOTE — ED Provider Notes (Signed)
  [facility]   [id number] 11/18/16 Arrival Time: 1254  ASSESSMENT & PLAN:  1. Lightheadedness   2. Nausea and vomiting, intractability of vomiting not specified, unspecified vomiting type     Meds ordered this encounter  Medications  . 0.9 %  sodium chloride infusion  . ondansetron (ZOFRAN) injection 4 mg  . ondansetron (ZOFRAN ODT) 4 MG disintegrating tablet    Sig: Take 1 tablet (4 mg total) by mouth every 8 (eight) hours as needed for nausea or vomiting.    Dispense:  12 tablet    Refill:  0   Unsure of exact etiology. Feeling better after IVF. No indication for hospital admission. Ensure adequate fluid intake. Outlined signs and symptoms indicating need for more acute intervention. Follow up here or in the Emergency Department if worsening or not improving over the next 24-48 hours. Patient verbalized understanding. After Visit Summary given.   SUBJECTIVE:  Philip Acosta is a 46 y.o. male who presents for follow up. Seen here two days ago. No worsening but reports he still feels very fatigued. Decreased PO intake mainly secondary to nausea but does report occasional small non-bloody emesis especially after he tries to eat or drink. No fever. No abdominal or back pain. Ambulatory. Still has a mild headache. No visual or hearing changes. No neck pain. Feels he cannot go back to work given how he's feeling.  ROS: As per HPI.   OBJECTIVE:  Vitals:   11/18/16 1312  BP: 132/89  Pulse: 69  Temp: 98.5 F (36.9 C)  TempSrc: Oral  SpO2: 96%     General appearance: alert, cooperative, no distress; appears fatiged Head: Normocephalic, without obvious abnormality, atraumatic Eyes: conjunctivae/corneas clear. PERRL, EOM's intact. Throat: lips, mucosa, and tongue normal; teeth and gums normal but slightly dry Neck: supple, symmetrical, trachea midline Back: No CVA tenderness. Lungs: clear to auscultation bilaterally Heart: regular rate and rhythm Abdomen: soft,  non-tender; bowel sounds normal; no masses,  no organomegaly; no guarding or rebound tenderness Extremities: extremities normal, atraumatic, no cyanosis or edema Skin: warm and dry Neurologic: Alert and oriented X 3, normal strength and tone. Normal symmetric reflexes. Normal gait   No Known Allergies  PMHx, SurgHx, SocialHx, Medications, and Allergies were reviewed in the Visit Navigator and updated as appropriate.       Mardella LaymanHagler, Shantelle Alles, MD 11/21/16 [phone]

## 2017-02-23 ENCOUNTER — Ambulatory Visit (HOSPITAL_COMMUNITY)
Admission: EM | Admit: 2017-02-23 | Discharge: 2017-02-23 | Disposition: A | Discharge status: Home / Self Care | Payer: 59 | Attending: Urgent Care | Admitting: Urgent Care

## 2017-02-23 ENCOUNTER — Encounter (HOSPITAL_COMMUNITY): Payer: Self-pay | Admitting: Emergency Medicine

## 2017-02-23 DIAGNOSIS — M722 Plantar fascial fibromatosis: Secondary | ICD-10-CM

## 2017-02-23 DIAGNOSIS — M79671 Pain in right foot: Secondary | ICD-10-CM | POA: Diagnosis not present

## 2017-02-23 MED ORDER — CELECOXIB 100 MG PO CAPS: 100.0000 mg | ORAL_CAPSULE | Freq: Two times a day (BID) | ORAL | 1 refills | Status: DC

## 2017-02-23 NOTE — ED Provider Notes (Signed)
MRN: [id number] DOB: 04/06/1971  Subjective:   Philip Acosta is a 46 y.o. male presenting for chief complaint of Foot Pain  Reports 2 day history of worsening, sharp, pulsating right heel pain, worse with standing and bearing weight. Has started wearing an ankle brace. Has not tried medications for relief. Has a history of plantar fasciitis. Denies history of diabetes, heart disease. Denies smoking cigarettes. Has strenuous job, wear boots with inserts but stands and walks during his entire shift at work.  Philip Acosta is not currently taking any medications and has No Known Allergies.  Philip Acosta  has a past medical history of Plantar fasciitis. Also  has a past surgical history that includes Appendectomy.  Objective:   Vitals: BP (!) 130/91   Pulse 60   Temp 99 F (37.2 C) (Oral)   Resp 16   Wt 220 lb (99.8 kg)   SpO2 100%   Physical Exam  Constitutional: He is oriented to person, place, and time. He appears well-developed and well-nourished.  Cardiovascular: Normal rate.   Pulmonary/Chest: Effort normal.  Musculoskeletal:       Right foot: There is tenderness (over arch of his foot). There is normal range of motion, no bony tenderness, no swelling, normal capillary refill, no crepitus, no deformity and no laceration.       Feet:  Neurological: He is alert and oriented to person, place, and time.  Skin: Skin is warm and dry.  Psychiatric: He has a normal mood and affect.   Assessment and Plan :   Arch pain of right foot  Foot pain, right  Plantar fasciitis of right foot  Without a trial of NSAID, rest, icing, modification of his work activities, I do not believe patient is okay to have steroid injection. I recommended 3 days from work, when he returns to have work restrictions as dictated by his orthopedist or PCP. He is to use celecoxib in the meantime, wear shoe inserts.   Wallis Bamberg, PA-C [facility]  02/23/2017  6:03 PM    Wallis Bamberg, PA-C 02/23/17 Rickey Primus

## 2017-02-23 NOTE — ED Triage Notes (Signed)
PT reports recurrent plantar fasciatus to right foot. PT reports pain has been present for 3 days. PT is wearing his brace.

## 2017-03-02 ENCOUNTER — Ambulatory Visit (INDEPENDENT_AMBULATORY_CARE_PROVIDER_SITE_OTHER): Payer: 59

## 2017-03-02 ENCOUNTER — Ambulatory Visit (INDEPENDENT_AMBULATORY_CARE_PROVIDER_SITE_OTHER): Payer: 59 | Admitting: Podiatry

## 2017-03-02 VITALS — BP 137/90 | HR 78

## 2017-03-02 DIAGNOSIS — M79672 Pain in left foot: Secondary | ICD-10-CM | POA: Diagnosis not present

## 2017-03-02 DIAGNOSIS — M722 Plantar fascial fibromatosis: Secondary | ICD-10-CM

## 2017-03-02 DIAGNOSIS — G8929 Other chronic pain: Secondary | ICD-10-CM

## 2017-03-02 MED ORDER — BETAMETHASONE SOD PHOS & ACET 6 (3-3) MG/ML IJ SUSP: 3.0000 mg | Freq: Once | INTRAMUSCULAR | Status: AC

## 2017-03-02 MED ORDER — METHYLPREDNISOLONE 4 MG PO TBPK: ORAL_TABLET | ORAL | 0 refills | Status: DC

## 2017-03-02 MED ORDER — DICLOFENAC SODIUM 75 MG PO TBEC: 75.0000 mg | DELAYED_RELEASE_TABLET | Freq: Two times a day (BID) | ORAL | 1 refills | Status: DC

## 2017-03-05 NOTE — Progress Notes (Signed)
   Subjective: Patient presents today for severe sharp, shooting pain and tenderness in the feet bilaterally for the past 3 years. He rates the right medial heel pain 10/10 and the left 2/10. He states the pain is gradually worsening. Patient states that it hurts in the morning with the first steps out of bed. Patient presents today for further treatment and evaluation.   Past Medical History:  Diagnosis Date  . Plantar fasciitis     Objective: Physical Exam General: The patient is alert and oriented x3 in no acute distress.  Dermatology: Skin is warm, dry and supple bilateral lower extremities. Negative for open lesions or macerations bilateral.   Vascular: Dorsalis Pedis and Posterior Tibial pulses palpable bilateral.  Capillary fill time is immediate to all digits.  Neurological: Epicritic and protective threshold intact bilateral.   Musculoskeletal: Tenderness to palpation at the medial calcaneal tubercale and through the insertion of the plantar fascia of the bilateral feet. All other joints range of motion within normal limits bilateral. Strength 5/5 in all groups bilateral.   Radiographic exam: Normal osseous mineralization. Joint spaces preserved. No fracture/dislocation/boney destruction. Calcaneal spur present with mild thickening of plantar fascia bilateral. No other soft tissue abnormalities or radiopaque foreign bodies.   Assessment: 1. plantar fasciitis bilateral feet  Plan of Care:  1. Patient evaluated. Xrays reviewed.   2. Injection of 0.5cc Celestone soluspan injected into the bilateral heels.  3. Rx for Medrol Dose Pak placed 4. Rx for Diclofenac  PO BID ordered for patient. 5. Plantar fascial band(s) dispensed for bilateral plantar fasciitis. 6. Instructed patient regarding therapies and modalities at home to alleviate symptoms.  7. Appt with [facility] for custom molded orthotics. 8. Return to clinic in 4 weeks.    Felecia Shelling, DPM [facility]  Dr. Felecia Shelling, DPM    [address] [place], [place] [address]                Office [phone]  Fax [phone]

## 2017-03-13 ENCOUNTER — Ambulatory Visit (HOSPITAL_COMMUNITY)
Admission: EM | Admit: 2017-03-13 | Discharge: 2017-03-13 | Disposition: A | Discharge status: Home / Self Care | Payer: 59 | Attending: Internal Medicine | Admitting: Internal Medicine

## 2017-03-13 ENCOUNTER — Ambulatory Visit (INDEPENDENT_AMBULATORY_CARE_PROVIDER_SITE_OTHER): Payer: 59

## 2017-03-13 ENCOUNTER — Encounter (HOSPITAL_COMMUNITY): Payer: Self-pay | Admitting: Emergency Medicine

## 2017-03-13 DIAGNOSIS — S93401A Sprain of unspecified ligament of right ankle, initial encounter: Secondary | ICD-10-CM | POA: Diagnosis not present

## 2017-03-13 MED ORDER — NAPROXEN 500 MG PO TABS: 500.0000 mg | ORAL_TABLET | Freq: Two times a day (BID) | ORAL | 0 refills | Status: DC

## 2017-03-13 NOTE — ED Provider Notes (Signed)
[facility]    CSN: [id number] Arrival date & time: 03/13/17  1824     History   Chief Complaint Chief Complaint  Patient presents with  . Ankle Pain    HPI Philip Acosta is a 46 y.o. male. he was seen at the urgent care on September 26 with a flareup of plantar fasciitis. He subsequently saw podiatry on October 3, for plantar fasciitis.  2 days ago, he got out of the car and twisted his right ankle. Had some swelling and pain. He is here today because of persistent pain and would like a work note.  No other injury reported.    HPI  Past Medical History:  Diagnosis Date  . Plantar fasciitis     Past Surgical History:  Procedure Laterality Date  . APPENDECTOMY         Home Medications    Prior to Admission medications   Medication Sig Start Date End Date Taking? Authorizing Provider  celecoxib (CELEBREX) 100 MG capsule Take 1 capsule (100 mg total) by mouth 2 (two) times daily. 02/23/17   Wallis Bamberg, PA-C  diclofenac (VOLTAREN) 75 MG EC tablet Take 1 tablet (75 mg total) by mouth 2 (two) times daily. 03/02/17   Felecia Shelling, DPM  methylPREDNISolone (MEDROL DOSEPAK) 4 MG TBPK tablet 6 day dose pack - take as directed 03/02/17   Felecia Shelling, DPM  naproxen (NAPROSYN) 500 MG tablet Take 1 tablet (500 mg total) by mouth 2 (two) times daily. 03/13/17   Eustace Moore, MD  ondansetron (ZOFRAN ODT) 4 MG disintegrating tablet Take 1 tablet (4 mg total) by mouth every 8 (eight) hours as needed for nausea or vomiting. 11/18/16   Mardella Layman, MD    Family History No family history on file.  Social History Social History  Substance Use Topics  . Smoking status: Never Smoker  . Smokeless tobacco: Never Used  . Alcohol use No     Allergies   Patient has no known allergies.   Review of Systems Review of Systems  All other systems reviewed and are negative.    Physical Exam Triage Vital Signs ED Triage Vitals  Enc Vitals Group     BP 03/13/17  1830 126/82     Pulse Rate 03/13/17 1830 76     Resp 03/13/17 1830 18     Temp 03/13/17 1830 97.9 F (36.6 C)     Temp src --      SpO2 03/13/17 1830 96 %     Weight --      Height --      Pain Score 03/13/17 1831 8     Pain Loc --    Updated Vital Signs BP 126/82 (BP Location: Left Arm)   Pulse 76   Temp 97.9 F (36.6 C)   Resp 18   SpO2 96%   Physical Exam  Constitutional: He is oriented to person, place, and time. No distress.  Alert, nicely groomed  HENT:  Head: Atraumatic.  Eyes:  Conjugate gaze, no eye redness/drainage  Neck: Neck supple.  Cardiovascular: Normal rate.   Pulmonary/Chest: No respiratory distress.  Abdominal: He exhibits no distension.  Musculoskeletal: Normal range of motion.  Contour of both ankles and feet are symmetric. No bruising appreciated. No focal tenderness, although the right lateral ankle is diffusely mildly tender to palpation.Good range of motion at the ankles, able to wiggle the toes. Foot is warm.  Neurological: He is alert and oriented to person, place,  and time.  Skin: Skin is warm and dry.  No cyanosis  Nursing note and vitals reviewed.    UC Treatments / Results   Procedures Procedures (including critical care time) None today  Final Clinical Impressions(s) / UC Diagnoses   Final diagnoses:  Mild sprain of right ankle, initial encounter   Xray at the urgent care this evening was negative for bony injury/fracture.  Anticipate gradual improvement in right ankle pain/swelling over the next several days.  Ice/elevate for 5-10 minutes several times daily to help with swelling/pain.  Naproxen (prescription sent to the pharmacy) will also help with the pain.  Note for work today/tomorrow, return to work on 10/16.  Followup with podiatrist for further guidance on managing longer term foot/ankle symptoms.  It may also be helpful to establish care with a primary care provider (a couple are listed in this handout) for managing longer  term health needs.    New Prescriptions New Prescriptions   NAPROXEN (NAPROSYN) 500 MG TABLET    Take 1 tablet (500 mg total) by mouth 2 (two) times daily.     Controlled Substance Prescriptions [place] Controlled Substance Registry consulted? No   Eustace Moore, MD 03/15/17 1229

## 2017-03-13 NOTE — ED Triage Notes (Addendum)
Pt states he twisted his R ankle Friday and it popped, c/o sore ankle.  Hx of bone spurs. Ambulatory with steady gait. Gave her tylenol last night and again this morning

## 2017-03-13 NOTE — Discharge Instructions (Addendum)
Xray at the urgent care this evening was negative for bony injury/fracture.  Anticipate gradual improvement in right ankle pain/swelling over the next several days.  Ice/elevate for 5-10 minutes several times daily to help with swelling/pain.  Naproxen (prescription sent to the pharmacy) will also help with the pain.  Note for work today/tomorrow, return to work on 10/16.  Followup with podiatrist for further guidance on managing longer term foot/ankle symptoms.  It may also be helpful to establish care with a primary care provider (a couple are listed in this handout) for managing longer term health needs.

## 2017-03-23 ENCOUNTER — Other Ambulatory Visit: Payer: 59 | Admitting: Orthotics

## 2017-03-30 ENCOUNTER — Ambulatory Visit: Payer: 59 | Admitting: Podiatry

## 2017-04-06 ENCOUNTER — Ambulatory Visit (INDEPENDENT_AMBULATORY_CARE_PROVIDER_SITE_OTHER): Payer: 59 | Admitting: Podiatry

## 2017-04-06 ENCOUNTER — Ambulatory Visit (INDEPENDENT_AMBULATORY_CARE_PROVIDER_SITE_OTHER): Payer: 59 | Admitting: Orthotics

## 2017-04-06 ENCOUNTER — Encounter: Payer: Self-pay | Admitting: Podiatry

## 2017-04-06 DIAGNOSIS — M722 Plantar fascial fibromatosis: Secondary | ICD-10-CM | POA: Diagnosis not present

## 2017-04-06 NOTE — Progress Notes (Signed)

## 2017-04-10 NOTE — Progress Notes (Signed)
   Subjective: Patient presents today for follow up evaluation of bilateral plantar fasciitis. He reports continued pain, right worse than left. He rates the right foot pain 8/10 and the left 4/10. Wearing the fascial braces help alleviate the pain. The injections and medications only provide mild relief. Patient presents today for further treatment and evaluation.   Past Medical History:  Diagnosis Date  . Plantar fasciitis     Objective: Physical Exam General: The patient is alert and oriented x3 in no acute distress.  Dermatology: Skin is warm, dry and supple bilateral lower extremities. Negative for open lesions or macerations bilateral.   Vascular: Dorsalis Pedis and Posterior Tibial pulses palpable bilateral.  Capillary fill time is immediate to all digits.  Neurological: Epicritic and protective threshold intact bilateral.   Musculoskeletal: Tenderness to palpation at the medial calcaneal tubercale and through the insertion of the plantar fascia of the bilateral feet. All other joints range of motion within normal limits bilateral. Strength 5/5 in all groups bilateral.    Assessment: 1. plantar fasciitis bilateral feet  Plan of Care:  1. Patient evaluated.  2. Injection of 0.5cc Celestone soluspan injected into the bilateral heels. This is the 4th series of injections. 2 were done by Dr. Brynda GreathousePetri, DPM. 3. Patient was scanned for custom molded orthotics today. 4. Continue taking Diclofenac and wearing fascial braces.  5. Return to clinic in 6-8 weeks. If not better, authorization for surgery will be done.   [place].   Felecia ShellingBrent M. Meghan Warshawsky, DPM [facility]  Dr. Felecia ShellingBrent M. Cire Deyarmin, DPM    [address]                                   [place], [place] [id number]                Office [phone]  Fax [phone]

## 2017-05-04 ENCOUNTER — Ambulatory Visit: Payer: 59 | Admitting: Orthotics

## 2017-05-04 DIAGNOSIS — M722 Plantar fascial fibromatosis: Secondary | ICD-10-CM

## 2017-05-04 NOTE — Progress Notes (Signed)
Patient came in today to pick up custom made foot orthotics.  The goals were accomplished and the patient reported no dissatisfaction with said orthotics.  Patient was advised of breakin period and how to report any issues. 

## 2017-05-18 ENCOUNTER — Encounter: Payer: 59 | Admitting: Podiatry

## 2017-05-30 NOTE — Progress Notes (Signed)
This encounter was created in error - please disregard.

## 2017-08-04 IMAGING — DX DG KNEE COMPLETE 4+V*R*
4 series · 4 of 4 positions shown · non-contrast
Comparison: None.

CLINICAL DATA: Initial valuation for acute pain, swelling.

EXAM:
RIGHT KNEE - COMPLETE 4+ VIEW

[knee ap]
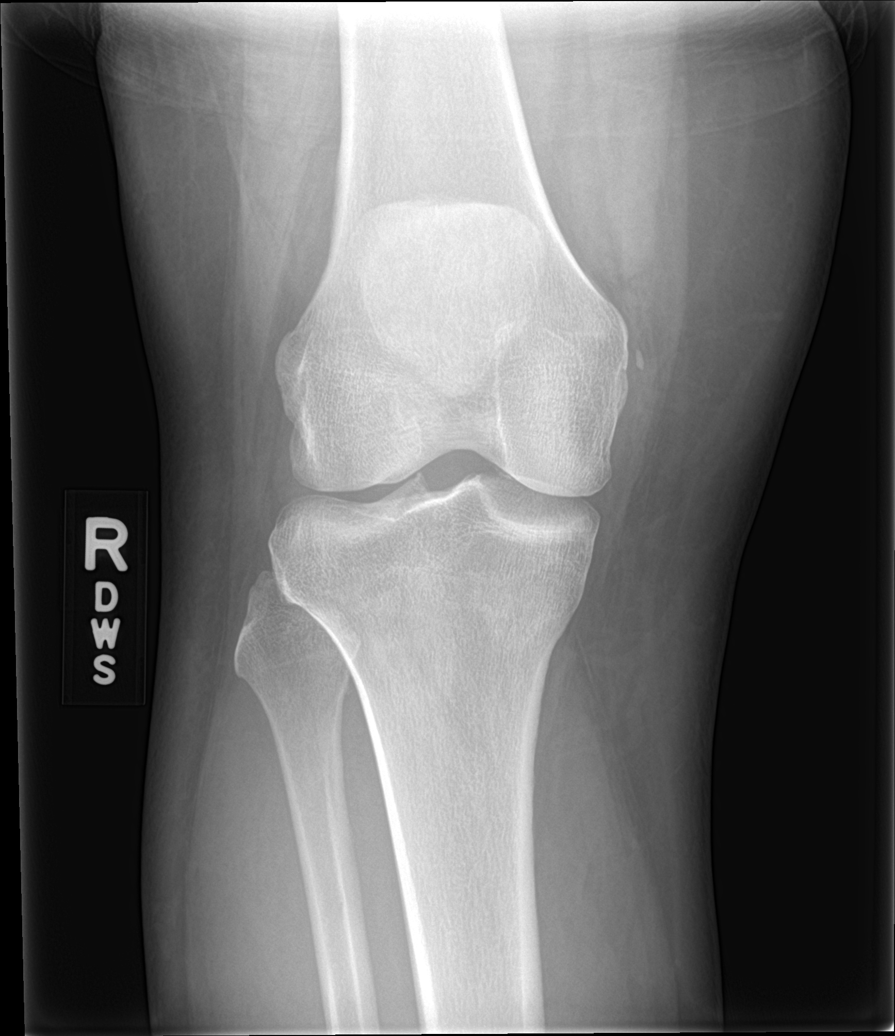

[knee obl (1 of 2)]
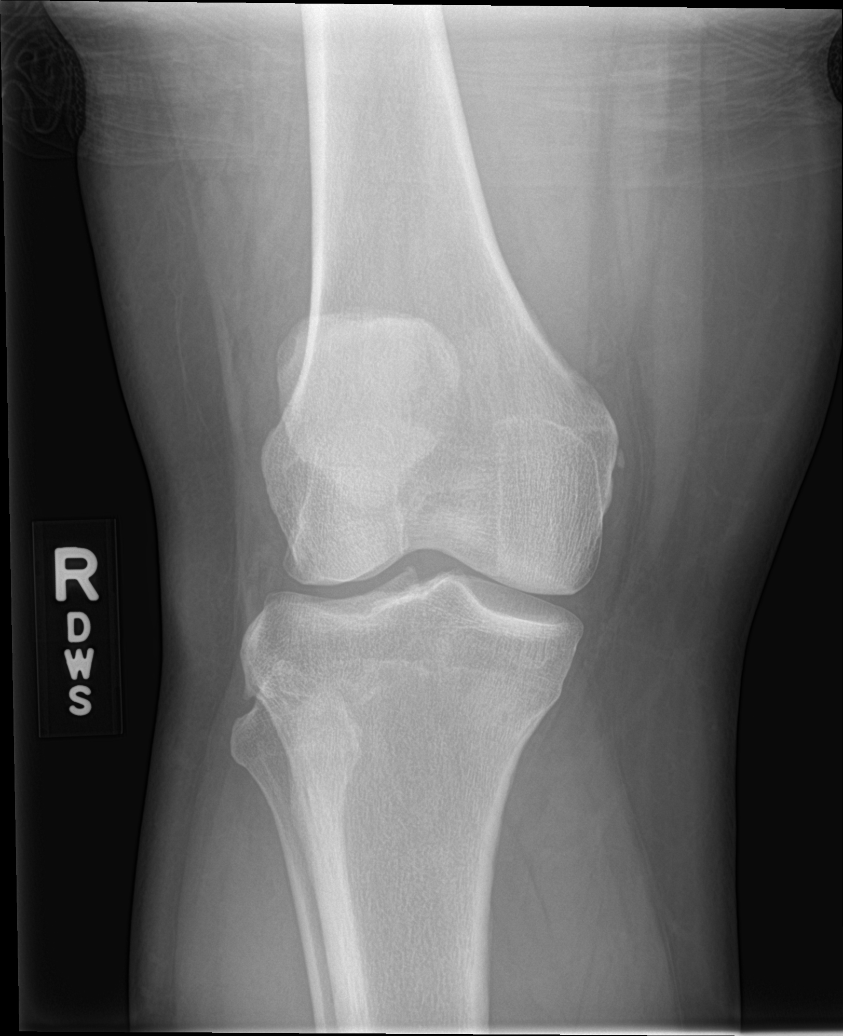

[knee obl (2 of 2)]
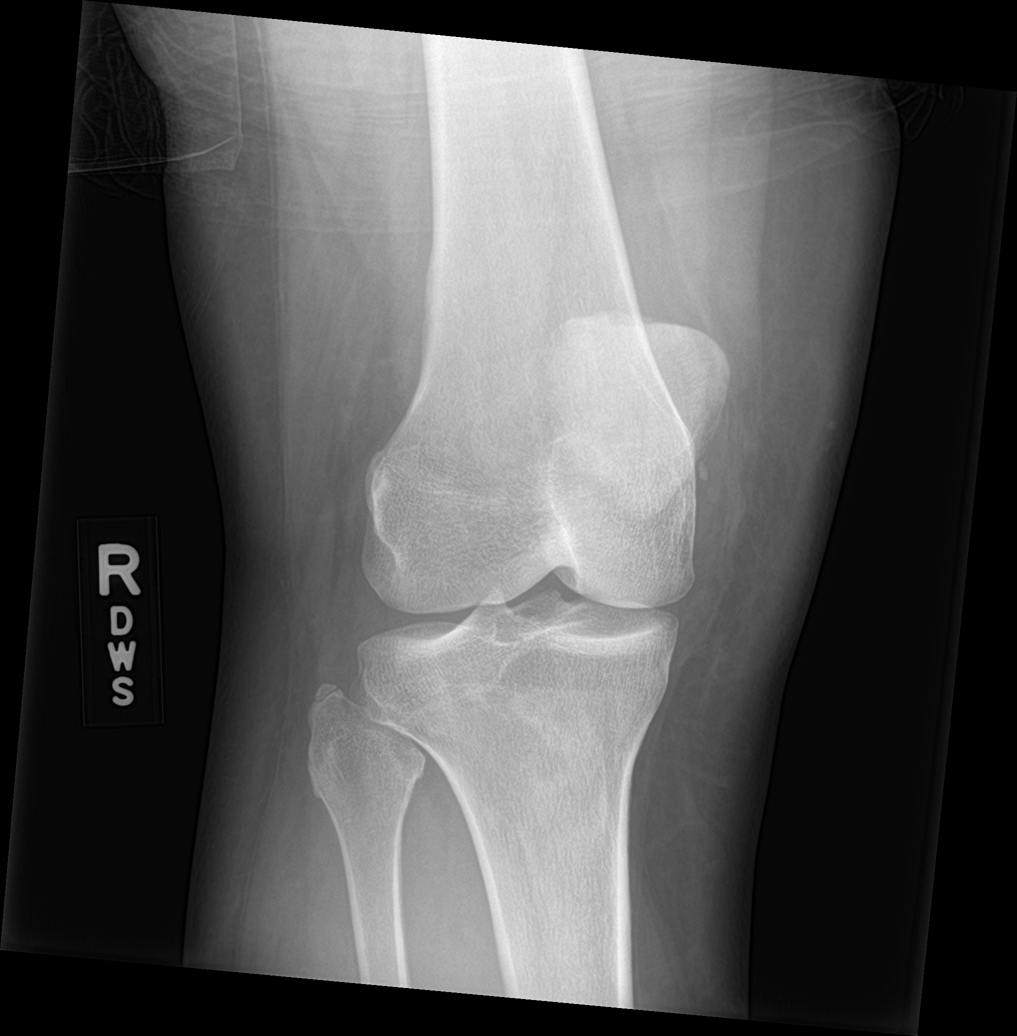

[knee lat]
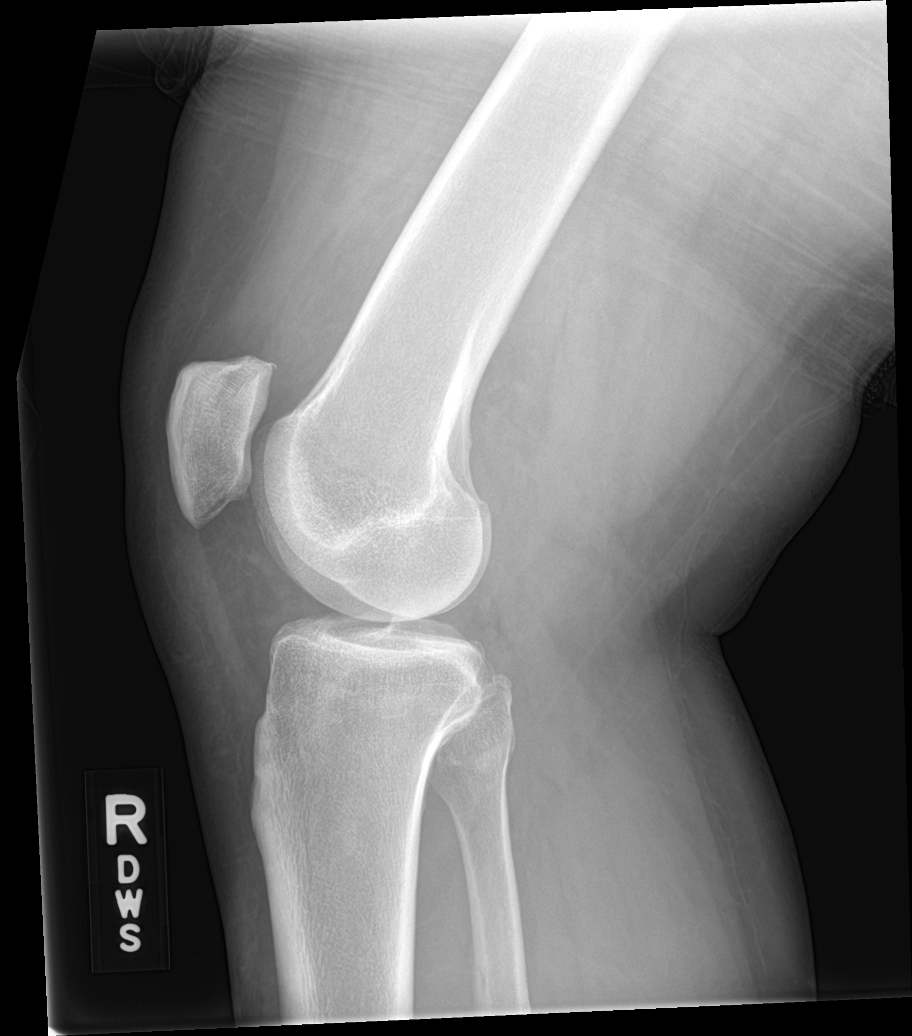

[4 of 4 positions shown; findings below may reference images not displayed]

FINDINGS: No acute fracture or dislocation. Moderate joint effusion. Mild
tricompartmental degenerative osteoarthrosis. Minimal
chondrocalcinosis noted. Heterotopic calcification noted adjacent to
the medial femoral condyle, which may reflect sequelae of prior
medial femoral collateral ligament injury or possibly calcific
tendinopathy. Osseous mineralization normal. No soft tissue
abnormality.
IMPRESSION: 1. No acute osseous abnormality about the knee.
2. Moderate joint effusion.

## 2018-02-03 ENCOUNTER — Encounter: Payer: 59 | Admitting: Medical

## 2018-06-04 IMAGING — DX DG ANKLE COMPLETE 3+V*R*
3 series · 3 of 3 positions shown · non-contrast
Comparison: None.

CLINICAL DATA: 45 y/o M; twisting injury of the ankle with pain.
Pain around the lateral malleolus.

EXAM:
RIGHT ANKLE - COMPLETE 3+ VIEW

[ankle ap]
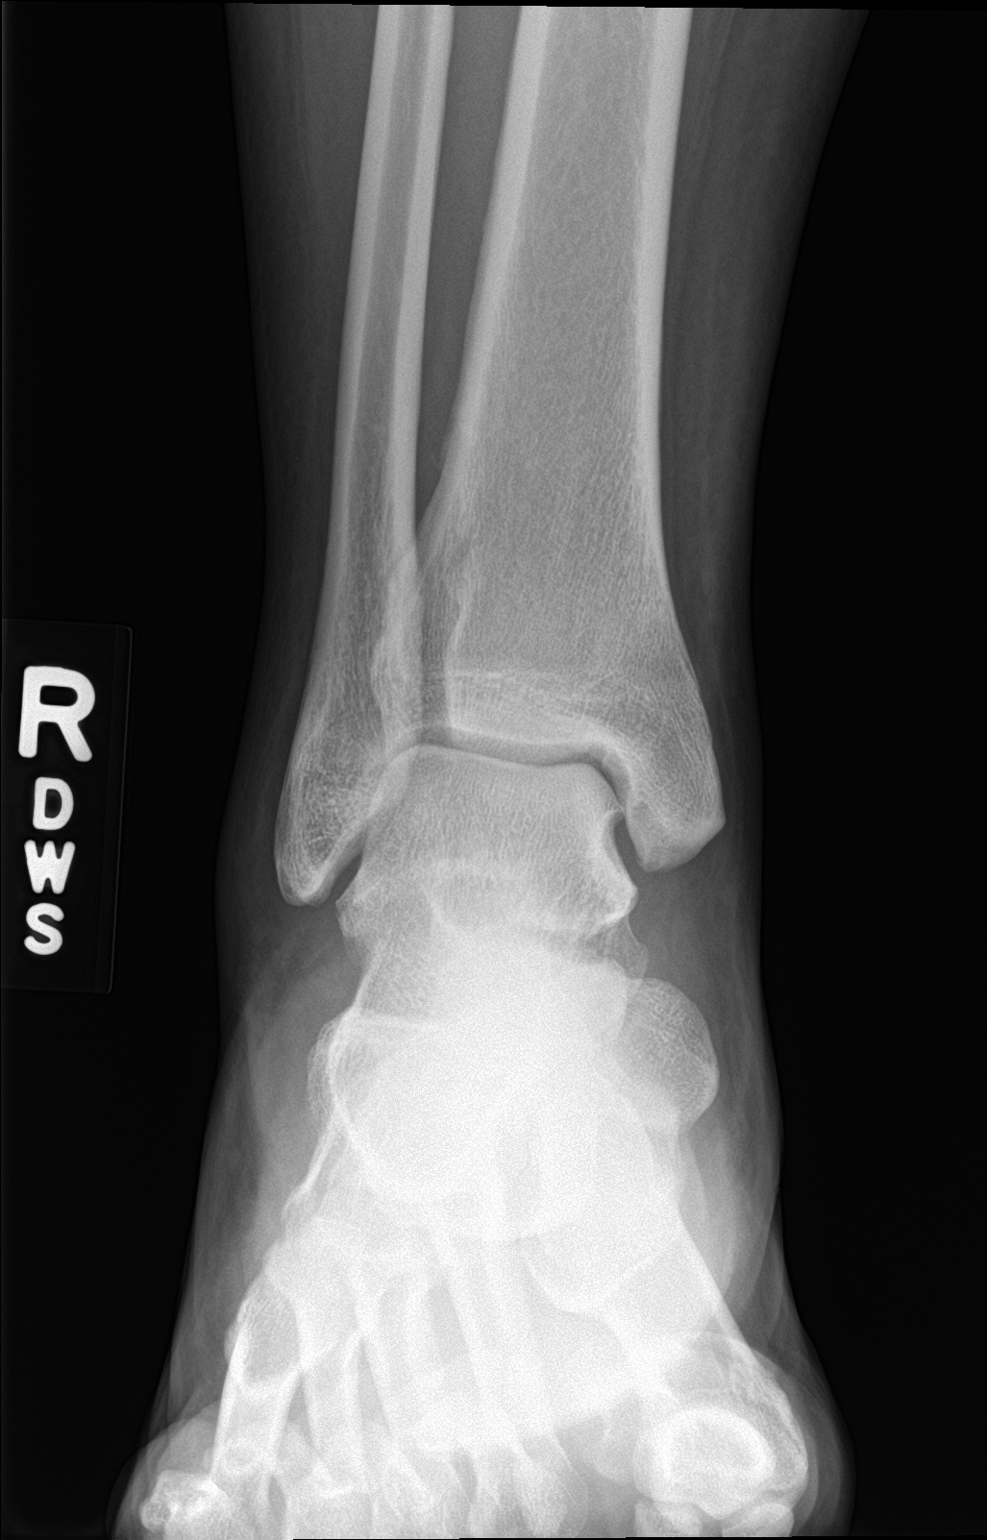

[ankle obl]
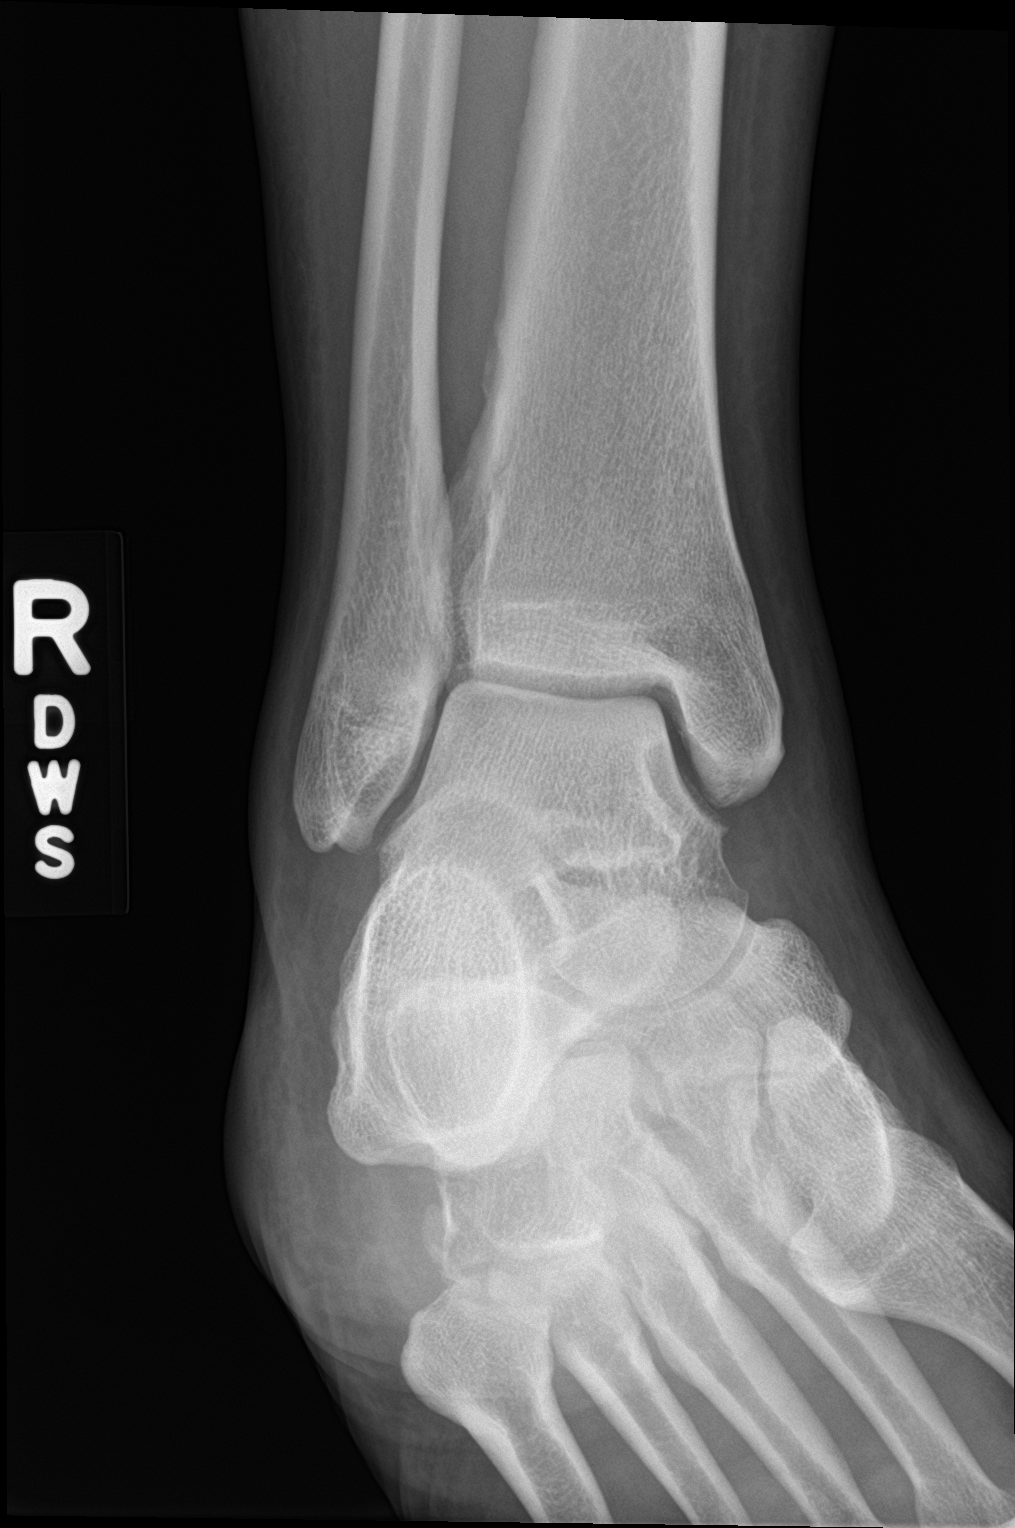

[ankle lat]
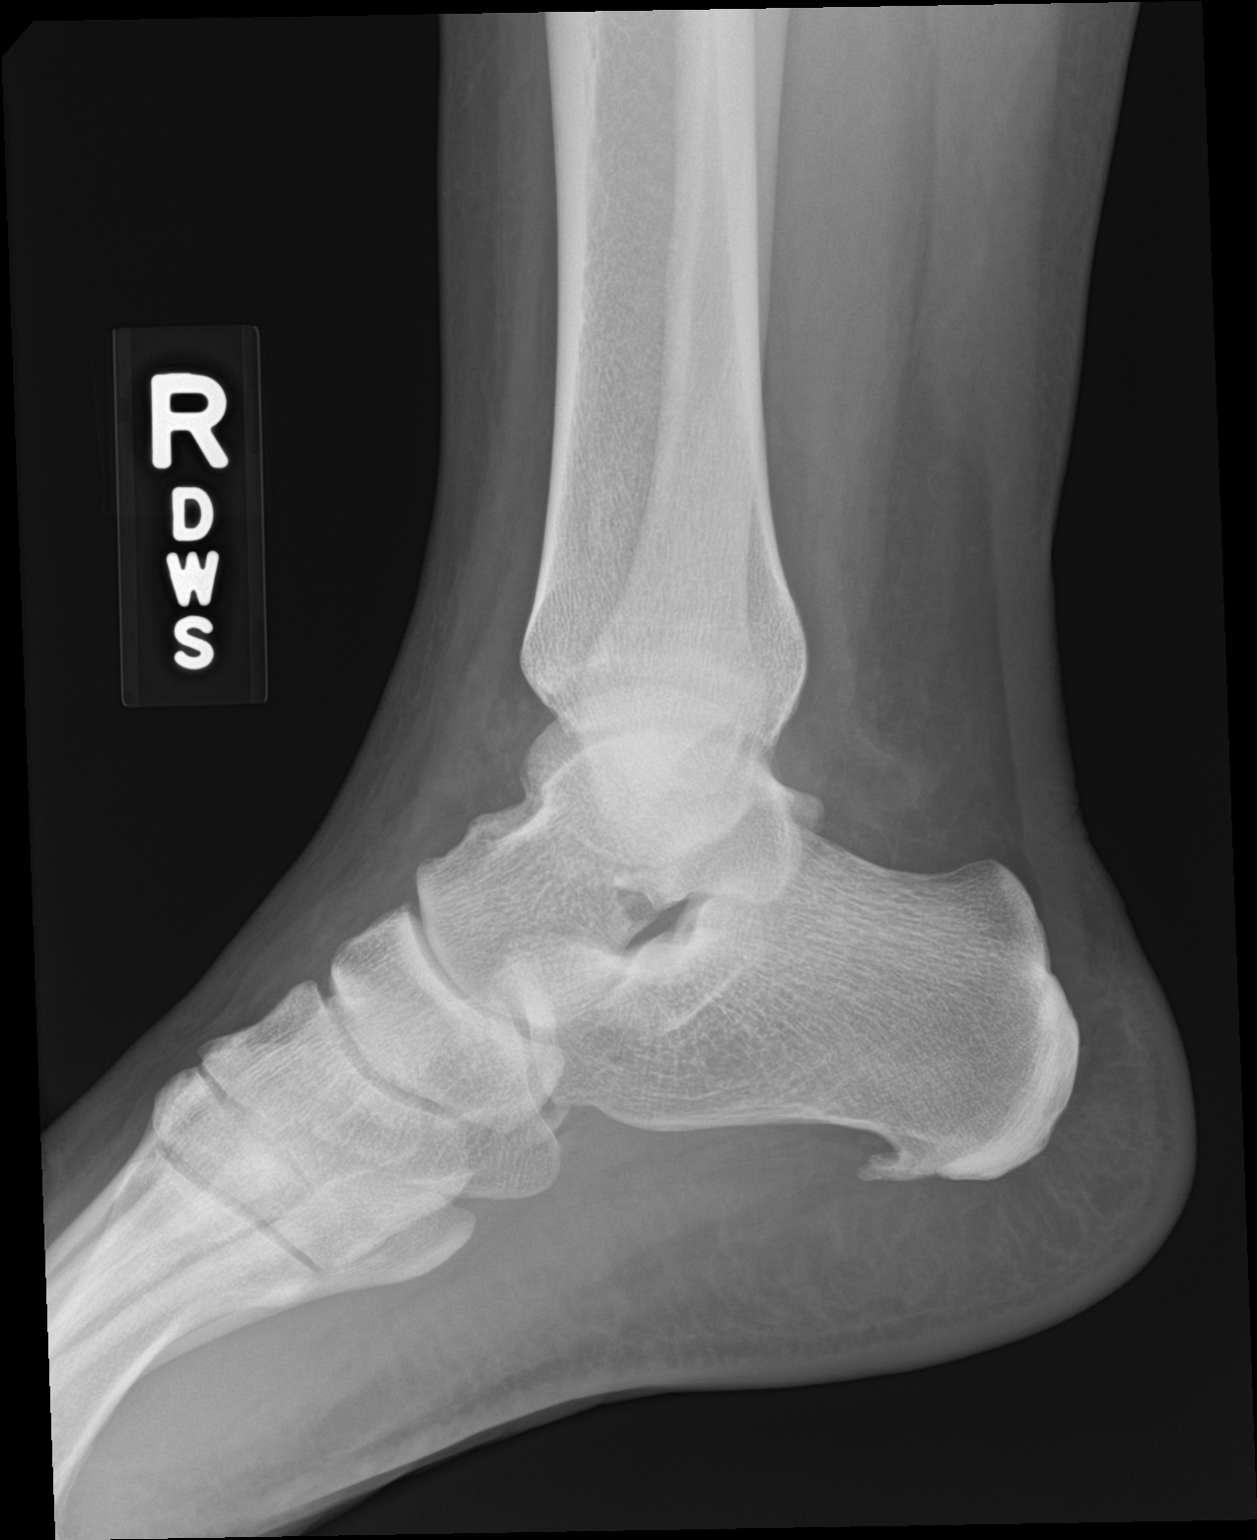

[3 of 3 positions shown; findings below may reference images not displayed]

FINDINGS: There is no evidence of fracture, dislocation, or joint effusion.
Talar dome is intact. Ankle mortise is symmetric on these nonstress
views. Small plantar calcaneal bone spur.
IMPRESSION: No acute fracture or dislocation identified.

By: Sorin Oxendine M.D.

## 2018-09-08 ENCOUNTER — Other Ambulatory Visit: Payer: Self-pay

## 2018-09-08 ENCOUNTER — Ambulatory Visit (HOSPITAL_COMMUNITY)
Admission: EM | Admit: 2018-09-08 | Discharge: 2018-09-08 | Disposition: A | Discharge status: Home / Self Care | Payer: 59

## 2018-09-08 ENCOUNTER — Encounter (HOSPITAL_COMMUNITY): Payer: Self-pay | Admitting: Emergency Medicine

## 2018-09-08 DIAGNOSIS — J069 Acute upper respiratory infection, unspecified: Secondary | ICD-10-CM

## 2018-09-08 NOTE — Discharge Instructions (Signed)
Push fluids to ensure adequate hydration and keep secretions thin.  Tylenol and/or ibuprofen as needed for pain or fevers.  Over the counter treatments as needed.  I recommend self-isolation for 7 days from onset of symptoms for at least 3 days following improvement of symptoms, whichever is longer.  If develop worsening of shortness of breath , fevers, dehydration please go to the ER.

## 2018-09-08 NOTE — ED Provider Notes (Signed)
[facility]    CSN: [id number] Arrival date & time: 09/08/18  [id number]     History   Chief Complaint Chief Complaint  Patient presents with  . Headache  . Fever    HPI Philip Acosta is a 48 y.o. male.   Philip Acosta presents with complaints of symptoms starting Tuesday- shortness of breath, headache, fever. 7/10 headache. Stomach feels "twisted" .  Body aches. 102 temp TMax when symptoms started 4/7. Some dry cough. "tickle" to back of throat. No pain to throat. Some left ear pain. Nausea, no vomiting. No diarrhea. Low appetite. Poor sleep. Can't relax at night. Still with body aches. No chest pain . No ill contacts. Lives at alone. Works at a [employer] with many other employees. Doesnt smoke. No asthma. Has been taking otc medications for symptoms, nothing today.    ROS per HPI, negative if not otherwise mentioned.      Past Medical History:  Diagnosis Date  . Plantar fasciitis     There are no active problems to display for this patient.   Past Surgical History:  Procedure Laterality Date  . APPENDECTOMY         Home Medications    Prior to Admission medications   Medication Sig Start Date End Date Taking? Authorizing Provider  celecoxib (CELEBREX) 100 MG capsule Take 1 capsule (100 mg total) by mouth 2 (two) times daily. Patient not taking: Reported on 09/08/2018 02/23/17   Wallis BambergMani, Mario, PA-C  diclofenac (VOLTAREN) 75 MG EC tablet Take 1 tablet (75 mg total) by mouth 2 (two) times daily. Patient not taking: Reported on 09/08/2018 03/02/17   Felecia ShellingEvans, Brent M, DPM  methylPREDNISolone (MEDROL DOSEPAK) 4 MG TBPK tablet 6 day dose pack - take as directed Patient not taking: Reported on 09/08/2018 03/02/17   Felecia ShellingEvans, Brent M, DPM  naproxen (NAPROSYN) 500 MG tablet Take 1 tablet (500 mg total) by mouth 2 (two) times daily. Patient not taking: Reported on 09/08/2018 03/13/17   Isa RankinMurray, Laura Wilson, MD  ondansetron (ZOFRAN ODT) 4 MG disintegrating tablet Take 1  tablet (4 mg total) by mouth every 8 (eight) hours as needed for nausea or vomiting. Patient not taking: Reported on 09/08/2018 11/18/16   Mardella LaymanHagler, Brian, MD    Family History No family history on file.  Social History Social History   Tobacco Use  . Smoking status: Never Smoker  . Smokeless tobacco: Never Used  Substance Use Topics  . Alcohol use: No  . Drug use: No     Allergies   Patient has no known allergies.   Review of Systems Review of Systems   Physical Exam Triage Vital Signs ED Triage Vitals  Enc Vitals Group     BP 09/08/18 0831 (!) 135/92     Pulse Rate 09/08/18 0830 71     Resp 09/08/18 0830 18     Temp 09/08/18 0830 (!) 97.4 F (36.3 C)     Temp src --      SpO2 09/08/18 0830 99 %     Weight --      Height --      Head Circumference --      Peak Flow --      Pain Score 09/08/18 0831 7     Pain Loc --      Pain Edu? --      Excl. in GC? --    No data found.  Updated Vital Signs BP (!) 135/92   Pulse 71  Temp (!) 97.4 F (36.3 C)   Resp 18   SpO2 99%   Visual Acuity Right Eye Distance:   Left Eye Distance:   Bilateral Distance:    Right Eye Near:   Left Eye Near:    Bilateral Near:     Physical Exam Vitals signs and nursing note reviewed.  Constitutional:      Appearance: He is well-developed.  HENT:     Head: Normocephalic and atraumatic.  Cardiovascular:     Rate and Rhythm: Normal rate.  Pulmonary:     Effort: Pulmonary effort is normal.  Neurological:     Mental Status: He is alert and oriented to person, place, and time.  Psychiatric:        Mood and Affect: Mood normal.        Behavior: Behavior normal.    Minimal physical exam at this time due to Covid-19 risk, patient alert, oriented, no increased work of breathing or obvious distress. Occasional dry cough noted.  UC Treatments / Results  Labs (all labs ordered are listed, but only abnormal results are displayed) Labs Reviewed - No data to display  EKG None   Radiology No results found.  Procedures Procedures (including critical care time)  Medications Ordered in UC Medications - No data to display  Initial Impression / Assessment and Plan / UC Course  I have reviewed the triage vital signs and the nursing notes.  Pertinent labs & imaging results that were available during my care of the patient were reviewed by me and considered in my medical decision making (see chart for details).     Non toxic. Afebrile today. Vitals stable. History and physical consistent with viral illness.  Supportive cares recommended. In presence of covid pandemic with new onset cough and fevers recommend self isolation. Discussed with patient. Return precautions provided. Patient verbalized understanding and agreeable to plan.   Final Clinical Impressions(s) / UC Diagnoses   Final diagnoses:  Viral upper respiratory tract infection     Discharge Instructions     Push fluids to ensure adequate hydration and keep secretions thin.  Tylenol and/or ibuprofen as needed for pain or fevers.  Over the counter treatments as needed.  I recommend self-isolation for 7 days from onset of symptoms for at least 3 days following improvement of symptoms, whichever is longer.  If develop worsening of shortness of breath , fevers, dehydration please go to the ER.     ED Prescriptions    None     Controlled Substance Prescriptions [place] Controlled Substance Registry consulted? Not Applicable   Georgetta Haber, NP 09/08/18 1027

## 2018-09-08 NOTE — ED Triage Notes (Signed)
Pt c/o headache, neck pain, states he had a fever a couple of days ago and was short of breath. Pt states "my stomach is twisting".

## 2019-09-24 ENCOUNTER — Encounter (HOSPITAL_COMMUNITY): Payer: Self-pay

## 2019-09-24 ENCOUNTER — Ambulatory Visit (HOSPITAL_COMMUNITY)
Admission: EM | Admit: 2019-09-24 | Discharge: 2019-09-24 | Disposition: A | Discharge status: Home / Self Care | Payer: 59 | Attending: Family Medicine | Admitting: Family Medicine

## 2019-09-24 ENCOUNTER — Other Ambulatory Visit: Payer: Self-pay

## 2019-09-24 DIAGNOSIS — Z79899 Other long term (current) drug therapy: Secondary | ICD-10-CM | POA: Insufficient documentation

## 2019-09-24 DIAGNOSIS — Z20822 Contact with and (suspected) exposure to covid-19: Secondary | ICD-10-CM | POA: Diagnosis not present

## 2019-09-24 DIAGNOSIS — R197 Diarrhea, unspecified: Secondary | ICD-10-CM

## 2019-09-24 DIAGNOSIS — R519 Headache, unspecified: Secondary | ICD-10-CM | POA: Diagnosis not present

## 2019-09-24 DIAGNOSIS — M549 Dorsalgia, unspecified: Secondary | ICD-10-CM | POA: Diagnosis not present

## 2019-09-24 DIAGNOSIS — R112 Nausea with vomiting, unspecified: Secondary | ICD-10-CM | POA: Diagnosis not present

## 2019-09-24 DIAGNOSIS — M542 Cervicalgia: Secondary | ICD-10-CM | POA: Diagnosis not present

## 2019-09-24 MED ORDER — ONDANSETRON 4 MG PO TBDP: 4.0000 mg | ORAL_TABLET | Freq: Three times a day (TID) | ORAL | 0 refills | Status: DC | PRN

## 2019-09-24 NOTE — ED Triage Notes (Signed)
Patient reports n/v/d since Friday as well as neck and back pain. Reports he felt feverish, but no measurable temperature.

## 2019-09-24 NOTE — Discharge Instructions (Signed)
I would expect your symptoms to gradually improve over the next few days  For nausea: Zofran prescribed. Begin with every 6 hours, than as you are able to hold food down, take it as needed. Start with clear liquids, then move to plain foods like bananas, rice, applesauce, toast, broth, grits, oatmeal. As those food settle okay you may transition to your normal foods. Avoid spicy and greasy foods as much as possible.  For Diarrhea: This is your body's natural way of getting rid of a virus. You may try taking 1 imodium to decrease amount of stools a day, but we do not want you to stop your diarrhea.   Preventing dehydration is key! You need to replace the fluid your body is expelling. Drink plenty of fluids, may use Pedialyte or sports drinks.   Please return if you are experiencing blood in your vomit or stool or experiencing dizziness, lightheadedness, extreme fatigue, increased abdominal pain.

## 2019-09-24 NOTE — ED Provider Notes (Signed)
[facility]    CSN: [id number] Arrival date & time: 09/24/19  1012      History   Chief Complaint Chief Complaint  Patient presents with  . n/v/d  . Neck Pain    HPI Philip Acosta is a 49 y.o. male history of appendectomy presenting today for evaluation of nausea vomiting and diarrhea.  Patient notes that since Friday he has had upset stomach with persistent nausea vomiting and diarrhea.  In the past many 4 hours has had approximately 5 episodes of vomiting, reports this is slightly improved since initial onset.  Diarrhea has slightly improved in frequency/consistency as well, but still persistent.  He denies any blood in the stool or vomit.  Reports very minimal abdominal pain.  Denies URI symptoms.  Has had subjective fevers.  Denies close contacts with similar symptoms.  Reports associated headache, back pain and neck pain.  HPI  Past Medical History:  Diagnosis Date  . Plantar fasciitis     There are no problems to display for this patient.   Past Surgical History:  Procedure Laterality Date  . APPENDECTOMY         Home Medications    Prior to Admission medications   Medication Sig Start Date End Date Taking? Authorizing Provider  ondansetron (ZOFRAN ODT) 4 MG disintegrating tablet Take 1 tablet (4 mg total) by mouth every 8 (eight) hours as needed for nausea or vomiting. 09/24/19   Jacek Colson, Junius Creamer, PA-C    Family History No family history on file.  Social History Social History   Tobacco Use  . Smoking status: Never Smoker  . Smokeless tobacco: Never Used  Substance Use Topics  . Alcohol use: No  . Drug use: No     Allergies   Patient has no known allergies.   Review of Systems Review of Systems  Constitutional: Positive for fatigue. Negative for activity change, appetite change, chills and fever.  HENT: Negative for congestion, ear pain, rhinorrhea, sinus pressure, sore throat and trouble swallowing.   Eyes: Negative for discharge  and redness.  Respiratory: Negative for cough, chest tightness and shortness of breath.   Cardiovascular: Negative for chest pain.  Gastrointestinal: Positive for diarrhea, nausea and vomiting. Negative for abdominal pain.  Musculoskeletal: Positive for back pain and myalgias.  Skin: Negative for rash.  Neurological: Positive for headaches. Negative for dizziness and light-headedness.     Physical Exam Triage Vital Signs ED Triage Vitals [09/24/19 1022]  Enc Vitals Group     BP 111/77     Pulse Rate 89     Resp 16     Temp 98.8 F (37.1 C)     Temp Source Oral     SpO2 98 %     Weight      Height      Head Circumference      Peak Flow      Pain Score 6     Pain Loc      Pain Edu?      Excl. in GC?    No data found.  Updated Vital Signs BP 111/77 (BP Location: Left Wrist)   Pulse 89   Temp 98.8 F (37.1 C) (Oral)   Resp 16   SpO2 98%   Visual Acuity Right Eye Distance:   Left Eye Distance:   Bilateral Distance:    Right Eye Near:   Left Eye Near:    Bilateral Near:     Physical Exam Vitals and nursing note  reviewed.  Constitutional:      Appearance: He is well-developed.  HENT:     Head: Normocephalic and atraumatic.     Ears:     Comments: Bilateral ears without tenderness to palpation of external auricle, tragus and mastoid, EAC's without erythema or swelling, TM's with good bony landmarks and cone of light. Non erythematous.     Mouth/Throat:     Comments: Oral mucosa pink and moist, no tonsillar enlargement or exudate. Posterior pharynx patent and nonerythematous, no uvula deviation or swelling. Normal phonation. Eyes:     Conjunctiva/sclera: Conjunctivae normal.  Cardiovascular:     Rate and Rhythm: Normal rate and regular rhythm.     Heart sounds: No murmur.  Pulmonary:     Effort: Pulmonary effort is normal. No respiratory distress.     Breath sounds: Normal breath sounds.     Comments: Breathing comfortably at rest, CTABL, no wheezing, rales  or other adventitious sounds auscultated Abdominal:     Palpations: Abdomen is soft.     Tenderness: There is no abdominal tenderness.     Comments: Large well-healed surgical scars midline and to right lower quadrant, soft, nondistended, nontender to light and deep palpation throughout entire abdomen  Musculoskeletal:     Cervical back: Neck supple.  Skin:    General: Skin is warm and dry.  Neurological:     Mental Status: He is alert.      UC Treatments / Results  Labs (all labs ordered are listed, but only abnormal results are displayed) Labs Reviewed  SARS CORONAVIRUS 2 (TAT 6-24 HRS)    EKG   Radiology No results found.  Procedures Procedures (including critical care time)  Medications Ordered in UC Medications - No data to display  Initial Impression / Assessment and Plan / UC Course  I have reviewed the triage vital signs and the nursing notes.  Pertinent labs & imaging results that were available during my care of the patient were reviewed by me and considered in my medical decision making (see chart for details).    Covid PCR pending. No abdominal tenderness, vital signs stable.  Most likely viral gastroenteritis, do not suspect abdominal emergency, negative peritoneal signs.  Recommending symptomatic and supportive care with continued close monitoring.  Zofran to control nausea along with oral rehydration.  Monitor for continued gradual improvement of symptoms.Discussed strict return precautions. Patient verbalized understanding and is agreeable with plan.  Final Clinical Impressions(s) / UC Diagnoses   Final diagnoses:  Nausea vomiting and diarrhea     Discharge Instructions     I would expect your symptoms to gradually improve over the next few days  For nausea: Zofran prescribed. Begin with every 6 hours, than as you are able to hold food down, take it as needed. Start with clear liquids, then move to plain foods like bananas, rice, applesauce,  toast, broth, grits, oatmeal. As those food settle okay you may transition to your normal foods. Avoid spicy and greasy foods as much as possible.  For Diarrhea: This is your body's natural way of getting rid of a virus. You may try taking 1 imodium to decrease amount of stools a day, but we do not want you to stop your diarrhea.   Preventing dehydration is key! You need to replace the fluid your body is expelling. Drink plenty of fluids, may use Pedialyte or sports drinks.   Please return if you are experiencing blood in your vomit or stool or experiencing dizziness, lightheadedness, extreme fatigue,  increased abdominal pain.    ED Prescriptions    Medication Sig Dispense Auth. Provider   ondansetron (ZOFRAN ODT) 4 MG disintegrating tablet Take 1 tablet (4 mg total) by mouth every 8 (eight) hours as needed for nausea or vomiting. 20 tablet Everet Flagg, [place] C, PA-C     PDMP not reviewed this encounter.   Curties Conigliaro, [place] C, PA-C 09/24/19 1052

## 2019-09-25 LAB — SARS CORONAVIRUS 2 (TAT 6-24 HRS): SARS Coronavirus 2: NEGATIVE

## 2019-09-29 ENCOUNTER — Ambulatory Visit: Payer: 59 | Attending: Internal Medicine

## 2019-09-29 DIAGNOSIS — Z23 Encounter for immunization: Secondary | ICD-10-CM

## 2019-09-29 NOTE — Progress Notes (Signed)
   Covid-19 Vaccination Clinic  Name:  Philip Acosta    MRN: [id number] DOB: 1970-06-25  09/29/2019  Mr. Philip Acosta was observed post Covid-19 immunization for 15 minutes without incident. He was provided with Vaccine Information Sheet and instruction to access the V-Safe system.   Mr. Philip Acosta was instructed to call 911 with any severe reactions post vaccine: Marland Kitchen Difficulty breathing  . Swelling of face and throat  . A fast heartbeat  . A bad rash all over body  . Dizziness and weakness   Immunizations Administered    Name Date Dose VIS Date Route   Pfizer COVID-19 Vaccine 09/29/2019  9:45 AM 0.3 mL 07/25/2018 Intramuscular   Manufacturer: [employer], [employer]   Lot: [id number]   NDC: [address]-4

## 2019-10-22 ENCOUNTER — Ambulatory Visit: Payer: 59 | Attending: Internal Medicine

## 2019-10-22 DIAGNOSIS — Z23 Encounter for immunization: Secondary | ICD-10-CM

## 2019-10-22 NOTE — Progress Notes (Signed)
   Covid-19 Vaccination Clinic  Name:  Philip Acosta    MRN: [id number] DOB: 1971/02/11  10/22/2019  [name] was observed post Covid-19 immunization for 15 minutes without incident. He was provided with Vaccine Information Sheet and instruction to access the V-Safe system.   [name] was instructed to call 911 with any severe reactions post vaccine: Marland Kitchen Difficulty breathing  . Swelling of face and throat  . A fast heartbeat  . A bad rash all over body  . Dizziness and weakness   Immunizations Administered    Name Date Dose VIS Date Route   Pfizer COVID-19 Vaccine 10/22/2019  8:17 AM 0.3 mL 07/25/2018 Intramuscular   Manufacturer: [employer], [employer]   Lot: [id number]   NDC: [address]-6

## 2019-11-28 ENCOUNTER — Ambulatory Visit (HOSPITAL_COMMUNITY)
Admission: EM | Admit: 2019-11-28 | Discharge: 2019-11-28 | Disposition: A | Discharge status: Home / Self Care | Payer: 59 | Attending: Internal Medicine | Admitting: Internal Medicine

## 2019-11-28 ENCOUNTER — Encounter (HOSPITAL_COMMUNITY): Payer: Self-pay

## 2019-11-28 ENCOUNTER — Other Ambulatory Visit: Payer: Self-pay

## 2019-11-28 DIAGNOSIS — R112 Nausea with vomiting, unspecified: Secondary | ICD-10-CM

## 2019-11-28 DIAGNOSIS — R197 Diarrhea, unspecified: Secondary | ICD-10-CM

## 2019-11-28 MED ORDER — ONDANSETRON 4 MG PO TBDP
4.0000 mg | ORAL_TABLET | Freq: Once | ORAL | Status: AC
  Administered 2019-11-28: 09:00:00 4 mg via ORAL

## 2019-11-28 MED ORDER — ONDANSETRON 4 MG PO TBDP
ORAL_TABLET | ORAL | Status: AC
  Filled 2019-11-28: qty 1

## 2019-11-28 MED ORDER — ONDANSETRON HCL 8 MG PO TABS: 8.0000 mg | ORAL_TABLET | Freq: Three times a day (TID) | ORAL | 0 refills | Status: DC | PRN

## 2019-11-28 NOTE — ED Provider Notes (Signed)
[facility]    CSN: [id number] Arrival date & time: 11/28/19  5027      History   Chief Complaint Chief Complaint  Patient presents with  . Emesis  . Abdominal Pain  . Weakness    HPI Philip Acosta is a 49 y.o. male. who presents with recurrent nausea which started 7 months ago after eating greasy foods. Then since Sunday when he ate well cooked steak has had N/V/D, total 10 emesis today, 6 yesterday and once today. Has had diarrhea about 2 times per day.  He could not work yesterday and today. Has not had screening colonoscopy yet, but is working on scheduling this.     Past Medical History:  Diagnosis Date  . Plantar fasciitis     There are no problems to display for this patient.   Past Surgical History:  Procedure Laterality Date  . APPENDECTOMY         Home Medications    Prior to Admission medications   Medication Sig Start Date End Date Taking? Authorizing Provider  ondansetron (ZOFRAN ODT) 4 MG disintegrating tablet Take 1 tablet (4 mg total) by mouth every 8 (eight) hours as needed for nausea or vomiting. 09/24/19   Wieters, Hallie C, PA-C  ondansetron (ZOFRAN) 8 MG tablet Take 1 tablet (8 mg total) by mouth every 8 (eight) hours as needed for nausea or vomiting. 11/28/19   Rodriguez-Southworth, Nettie Elm, PA-C    Family History History reviewed. No pertinent family history.  Social History Social History   Tobacco Use  . Smoking status: Never Smoker  . Smokeless tobacco: Never Used  Vaping Use  . Vaping Use: Never used  Substance Use Topics  . Alcohol use: No  . Drug use: No     Allergies   Patient has no known allergies.   Review of Systems Review of Systems  Constitutional: Positive for diaphoresis. Negative for activity change, appetite change, chills and fever.       Had mild sweats once during emesis  HENT: Negative for congestion.   Respiratory: Negative for cough and shortness of breath.   Cardiovascular: Negative for  chest pain and leg swelling.  Gastrointestinal: Positive for diarrhea, nausea and vomiting. Negative for abdominal distention, abdominal pain and blood in stool.  Genitourinary: Negative for difficulty urinating.  Musculoskeletal: Positive for neck pain. Negative for arthralgias and gait problem.       States his neck feel a little achy  Skin: Negative for rash.  Neurological: Positive for light-headedness. Negative for dizziness and weakness.  Hematological: Negative for adenopathy.     Physical Exam Triage Vital Signs ED Triage Vitals [11/28/19 0845]  Enc Vitals Group     BP 126/86     Pulse Rate 67     Resp 18     Temp 98.6 F (37 C)     Temp Source Oral     SpO2 98 %     Weight      Height      Head Circumference      Peak Flow      Pain Score 5     Pain Loc      Pain Edu?      Excl. in GC?    No data found.  Updated Vital Signs BP 126/86 (BP Location: Left Arm)   Pulse 67   Temp 98.6 F (37 C) (Oral)   Resp 18   SpO2 98%   Visual Acuity Right Eye Distance:  Left Eye Distance:   Bilateral Distance:    Right Eye Near:   Left Eye Near:    Bilateral Near:     Physical Exam Vitals and nursing note reviewed.  Constitutional:      General: He is not in acute distress.    Appearance: He is obese. He is not toxic-appearing.  HENT:     Head: Atraumatic.     Mouth/Throat:     Mouth: Mucous membranes are moist.  Eyes:     General: No scleral icterus.    Extraocular Movements: Extraocular movements intact.  Cardiovascular:     Rate and Rhythm: Normal rate and regular rhythm.  Pulmonary:     Effort: Pulmonary effort is normal.     Breath sounds: Normal breath sounds.  Abdominal:     General: Abdomen is protuberant. Bowel sounds are normal. There is no distension or abdominal bruit. There are no signs of injury.     Palpations: Abdomen is soft. There is no hepatomegaly, splenomegaly or mass.     Tenderness: There is abdominal tenderness in the left lower  quadrant. There is no guarding or rebound.     Comments: Has mild tenderness on LLQ which he was not aware of until I palpated him.   Skin:    General: Skin is warm and dry.     Findings: No rash.  Neurological:     General: No focal deficit present.     Mental Status: He is alert and oriented to person, place, and time.  Psychiatric:        Mood and Affect: Mood normal.        Behavior: Behavior normal.      UC Treatments / Results  Labs (all labs ordered are listed, but only abnormal results are displayed) Labs Reviewed - No data to display  EKG   Radiology No results found.  Procedures Procedures (including critical care time)  Medications Ordered in UC Medications  ondansetron (ZOFRAN-ODT) disintegrating tablet 4 mg (4 mg Oral Given 11/28/19 0906)    Initial Impression / Assessment and Plan / UC Course  I have reviewed the triage vital signs and the nursing notes. He was given Zofran ODT 4 mg and watched to keep down 12 oz of water which he did and was sent home to stay on BRAT diet. See instructions.      Final Clinical Impressions(s) / UC Diagnoses   Final diagnoses:  Nausea vomiting and diarrhea     Discharge Instructions     Stay on clear liquids until you are able to hold them down, then introduce crackers or toast, advance slowly to apple sauce, bananas, broth and sports drinks in the next 48h.  You may take Kaopectate for the diarrhea if you need to. Once you keep those down, you may go back to regular diet. Follow up with your family Dr or gastroenterologist regarding intolerance to greasy food, so he/she can order tests to check your gallbladder. Make sure to schedule your colonoscopy.     ED Prescriptions    Medication Sig Dispense Auth. Provider   ondansetron (ZOFRAN) 8 MG tablet Take 1 tablet (8 mg total) by mouth every 8 (eight) hours as needed for nausea or vomiting. 20 tablet Rodriguez-Southworth, Nettie Elm, PA-C     PDMP not reviewed this  encounter.   Garey Ham, [place] 11/28/19 [phone]

## 2019-11-28 NOTE — Discharge Instructions (Addendum)
Stay on clear liquids until you are able to hold them down, then introduce crackers or toast, advance slowly to apple sauce, bananas, broth and sports drinks in the next 48h.  You may take Kaopectate for the diarrhea if you need to. Once you keep those down, you may go back to regular diet. Follow up with your family Dr or gastroenterologist regarding intolerance to greasy food, so he/she can order tests to check your gallbladder. Make sure to schedule your colonoscopy.

## 2019-11-28 NOTE — ED Triage Notes (Addendum)
Pt reports abdominal pain and vomitting or have loose stools everytime after he eats fir the past days. States feeling weak. Denies fever, chills.

## 2020-08-15 ENCOUNTER — Encounter (HOSPITAL_COMMUNITY): Payer: Self-pay | Admitting: Emergency Medicine

## 2020-08-15 ENCOUNTER — Other Ambulatory Visit: Payer: Self-pay

## 2020-08-15 ENCOUNTER — Ambulatory Visit (HOSPITAL_COMMUNITY)
Admission: EM | Admit: 2020-08-15 | Discharge: 2020-08-15 | Disposition: A | Discharge status: Home / Self Care | Payer: 59 | Attending: Urgent Care | Admitting: Urgent Care

## 2020-08-15 DIAGNOSIS — R197 Diarrhea, unspecified: Secondary | ICD-10-CM | POA: Insufficient documentation

## 2020-08-15 DIAGNOSIS — Z20822 Contact with and (suspected) exposure to covid-19: Secondary | ICD-10-CM | POA: Insufficient documentation

## 2020-08-15 DIAGNOSIS — J069 Acute upper respiratory infection, unspecified: Secondary | ICD-10-CM | POA: Diagnosis not present

## 2020-08-15 DIAGNOSIS — R0981 Nasal congestion: Secondary | ICD-10-CM

## 2020-08-15 DIAGNOSIS — R070 Pain in throat: Secondary | ICD-10-CM

## 2020-08-15 LAB — SARS CORONAVIRUS 2 (TAT 6-24 HRS): SARS Coronavirus 2: NEGATIVE

## 2020-08-15 MED ORDER — BENZONATATE 100 MG PO CAPS: 100.0000 mg | ORAL_CAPSULE | Freq: Three times a day (TID) | ORAL | 0 refills | Status: AC | PRN

## 2020-08-15 MED ORDER — PROMETHAZINE-DM 6.25-15 MG/5ML PO SYRP: 5.0000 mL | ORAL_SOLUTION | Freq: Every evening | ORAL | 0 refills | Status: AC | PRN

## 2020-08-15 MED ORDER — CETIRIZINE HCL 10 MG PO TABS: 10.0000 mg | ORAL_TABLET | Freq: Every day | ORAL | 0 refills | Status: AC

## 2020-08-15 MED ORDER — PSEUDOEPHEDRINE HCL 60 MG PO TABS: 60.0000 mg | ORAL_TABLET | Freq: Three times a day (TID) | ORAL | 0 refills | Status: AC | PRN

## 2020-08-15 MED ORDER — ONDANSETRON 8 MG PO TBDP: 8.0000 mg | ORAL_TABLET | Freq: Three times a day (TID) | ORAL | 0 refills | Status: AC | PRN

## 2020-08-15 NOTE — ED Provider Notes (Signed)
[name]   MRN: [id number] DOB: [birth year]  Subjective:   Philip Acosta is a 50 y.o. male presenting for 3-day history of acute onset cough, sinus congestion, throat pain, sinus headaches, fevers, diarrhea.  Patient took TheraFlu this morning with some relief.  He is COVID vaccinated, has his booster as well.  Denies history of lung disorders, no smoking or alcohol use.  Denies taking chronic medications.  No Known Allergies  Past Medical History:  Diagnosis Date  . Plantar fasciitis      Past Surgical History:  Procedure Laterality Date  . APPENDECTOMY      Family History  Problem Relation Age of Onset  . Healthy Mother     Social History   Tobacco Use  . Smoking status: Never Smoker  . Smokeless tobacco: Never Used  Vaping Use  . Vaping Use: Never used  Substance Use Topics  . Alcohol use: No  . Drug use: No    ROS   Objective:   Vitals: BP 135/87 (BP Location: Left Arm)   Pulse 74   Temp 100 F (37.8 C) (Oral)   Resp 18   SpO2 96%   Physical Exam Constitutional:      General: He is not in acute distress.    Appearance: Normal appearance. He is well-developed and normal weight. He is not ill-appearing, toxic-appearing or diaphoretic.  HENT:     Head: Normocephalic and atraumatic.     Right Ear: Tympanic membrane, ear canal and external ear normal. There is no impacted cerumen.     Left Ear: Tympanic membrane, ear canal and external ear normal. There is no impacted cerumen.     Nose: Nose normal. No congestion or rhinorrhea.     Mouth/Throat:     Mouth: Mucous membranes are moist.     Pharynx: No oropharyngeal exudate or posterior oropharyngeal erythema.     Comments: Significant postnasal drainage overlying pharynx. Eyes:     General: No scleral icterus.       Right eye: No discharge.        Left eye: No discharge.     Extraocular Movements: Extraocular movements intact.     Conjunctiva/sclera: Conjunctivae normal.      Pupils: Pupils are equal, round, and reactive to light.  Cardiovascular:     Rate and Rhythm: Normal rate and regular rhythm.     Heart sounds: Normal heart sounds. No murmur heard. No friction rub. No gallop.   Pulmonary:     Effort: Pulmonary effort is normal. No respiratory distress.     Breath sounds: Normal breath sounds. No stridor. No wheezing, rhonchi or rales.  Abdominal:     General: Bowel sounds are normal. There is no distension.     Palpations: Abdomen is soft. There is no mass.     Tenderness: There is no abdominal tenderness. There is no guarding or rebound.  Musculoskeletal:     Cervical back: Normal range of motion and neck supple. No rigidity. No muscular tenderness.  Neurological:     General: No focal deficit present.     Mental Status: He is alert and oriented to person, place, and time.  Psychiatric:        Mood and Affect: Mood normal.        Behavior: Behavior normal.        Thought Content: Thought content normal.        Judgment: Judgment normal.       Assessment and Plan :  PDMP not reviewed this encounter.  1. Viral URI with cough   2. Nasal congestion   3. Throat pain   4. Diarrhea, unspecified type     Will manage for viral illness such as viral URI, viral syndrome, viral rhinitis, high suspicion for COVID-19 given his symptoms set. Counseled patient on nature of COVID-19 including modes of transmission, diagnostic testing, management and supportive care.  Offered scripts for symptomatic relief. COVID 19 testing is pending. Counseled patient on potential for adverse effects with medications prescribed/recommended today, ER and return-to-clinic precautions discussed, patient verbalized understanding.     Wallis Bamberg, PA-C 08/15/20 1219

## 2020-08-15 NOTE — ED Triage Notes (Addendum)
Patient presents to [facility] for assessment of 3 days of cough, nasal congestion, sore throat, diarrhea, fevers, headaches.  Pt took theraflu approx 3am this morning

## 2020-08-15 NOTE — Discharge Instructions (Addendum)
